# Patient Record
Sex: Female | Born: 1995 | Race: White | Hispanic: No | Marital: Married | Smoking: Never smoker
Health system: Southern US, Community
[De-identification: ages and names within clinical notes are randomized; demographics above are authoritative.]

## PROBLEM LIST (undated history)

## (undated) DIAGNOSIS — Z8041 Family history of malignant neoplasm of ovary: Secondary | ICD-10-CM

## (undated) DIAGNOSIS — Z789 Other specified health status: Secondary | ICD-10-CM

## (undated) DIAGNOSIS — Z8489 Family history of other specified conditions: Secondary | ICD-10-CM

## (undated) DIAGNOSIS — Z8481 Family history of carrier of genetic disease: Secondary | ICD-10-CM

## (undated) DIAGNOSIS — Z803 Family history of malignant neoplasm of breast: Secondary | ICD-10-CM

## (undated) DIAGNOSIS — O1490 Unspecified pre-eclampsia, unspecified trimester: Secondary | ICD-10-CM

## (undated) DIAGNOSIS — F419 Anxiety disorder, unspecified: Secondary | ICD-10-CM

## (undated) DIAGNOSIS — R7989 Other specified abnormal findings of blood chemistry: Secondary | ICD-10-CM

## (undated) DIAGNOSIS — O1410 Severe pre-eclampsia, unspecified trimester: Secondary | ICD-10-CM

## (undated) HISTORY — DX: Unspecified pre-eclampsia, unspecified trimester: O14.90

## (undated) HISTORY — DX: Family history of malignant neoplasm of breast: Z80.3

## (undated) HISTORY — PX: NO PAST SURGERIES: SHX2092

## (undated) HISTORY — DX: Family history of malignant neoplasm of ovary: Z80.41

## (undated) HISTORY — PX: WISDOM TOOTH EXTRACTION: SHX21

## (undated) HISTORY — DX: Other specified abnormal findings of blood chemistry: R79.89

## (undated) HISTORY — DX: Severe pre-eclampsia, unspecified trimester: O14.10

## (undated) HISTORY — DX: Family history of other specified conditions: Z84.89

## (undated) HISTORY — DX: Anxiety disorder, unspecified: F41.9

## (undated) HISTORY — DX: Family history of carrier of genetic disease: Z84.81

---

## 2014-01-28 ENCOUNTER — Emergency Department (HOSPITAL_COMMUNITY): Payer: No Typology Code available for payment source

## 2014-01-28 ENCOUNTER — Encounter (HOSPITAL_COMMUNITY): Payer: Self-pay | Admitting: Emergency Medicine

## 2014-01-28 ENCOUNTER — Emergency Department (HOSPITAL_COMMUNITY)
Admission: EM | Admit: 2014-01-28 | Discharge: 2014-01-28 | Disposition: A | Discharge status: Home / Self Care | Payer: No Typology Code available for payment source | Attending: Emergency Medicine | Admitting: Emergency Medicine

## 2014-01-28 DIAGNOSIS — R109 Unspecified abdominal pain: Secondary | ICD-10-CM | POA: Insufficient documentation

## 2014-01-28 DIAGNOSIS — Z3202 Encounter for pregnancy test, result negative: Secondary | ICD-10-CM | POA: Diagnosis not present

## 2014-01-28 DIAGNOSIS — N201 Calculus of ureter: Secondary | ICD-10-CM | POA: Diagnosis not present

## 2014-01-28 LAB — URINALYSIS, ROUTINE W REFLEX MICROSCOPIC
BILIRUBIN URINE: NEGATIVE
Glucose, UA: NEGATIVE mg/dL
Ketones, ur: NEGATIVE mg/dL
Leukocytes, UA: NEGATIVE
Nitrite: NEGATIVE
Protein, ur: NEGATIVE mg/dL
Specific Gravity, Urine: 1.016 (ref 1.005–1.030)
UROBILINOGEN UA: 0.2 mg/dL (ref 0.0–1.0)
pH: 5.5 (ref 5.0–8.0)

## 2014-01-28 LAB — POC URINE PREG, ED: Preg Test, Ur: NEGATIVE

## 2014-01-28 LAB — URINE MICROSCOPIC-ADD ON

## 2014-01-28 IMAGING — CT CT RENAL STONE PROTOCOL
1 series · 15 of 29 positions shown, 19 images · non-contrast
Comparison: None.

CLINICAL DATA: Left flank pain, hematuria.

EXAM:
CT RENAL STONE PROTOCOL
TECHNIQUE: Multidetector CT imaging of the abdomen and pelvis was performed
following the standard protocol without intravenous contrast

[Series 4: lung · axial · 0.62mm/px · z∈[-152,-27]mm · 15 of 29 slices shown, 19 images]
[im 3/29  soft-tissue]
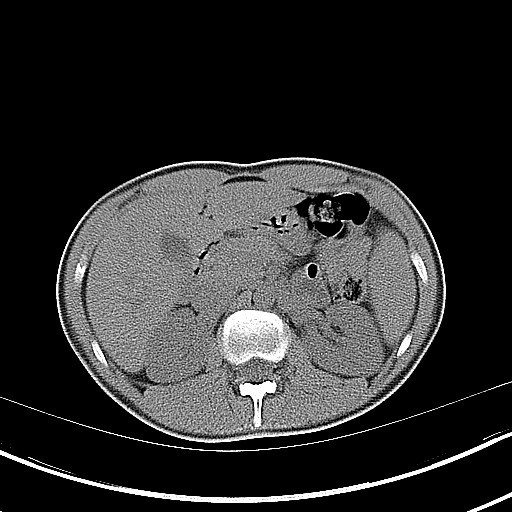
[im 3/29  bone]
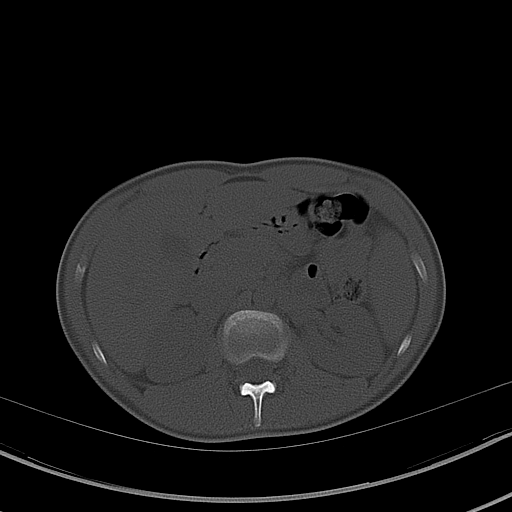
[im 5/29  soft-tissue]
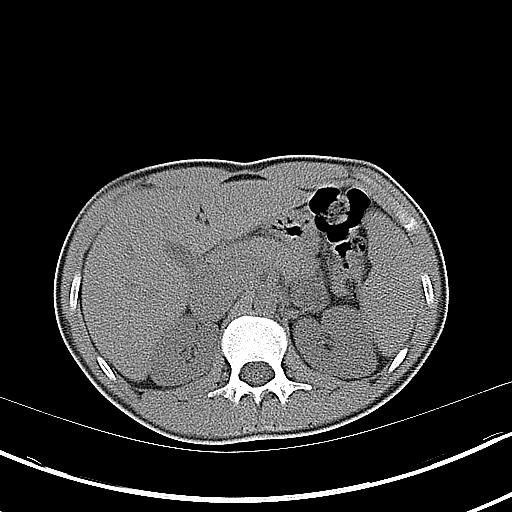
[im 7/29  soft-tissue]
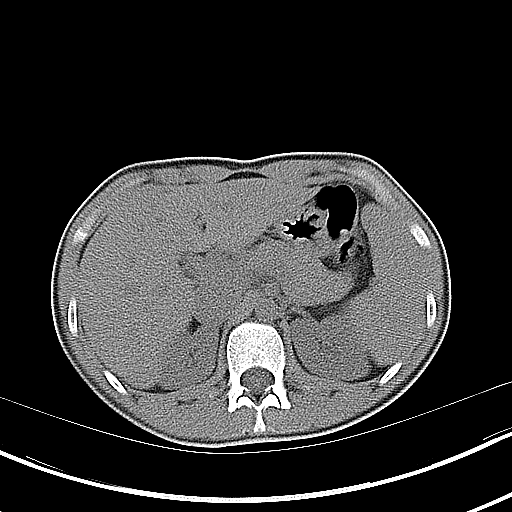
[im 9/29  soft-tissue]
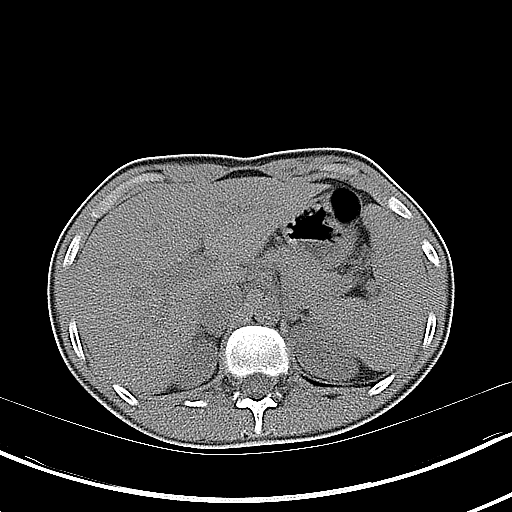
[im 11/29  soft-tissue]
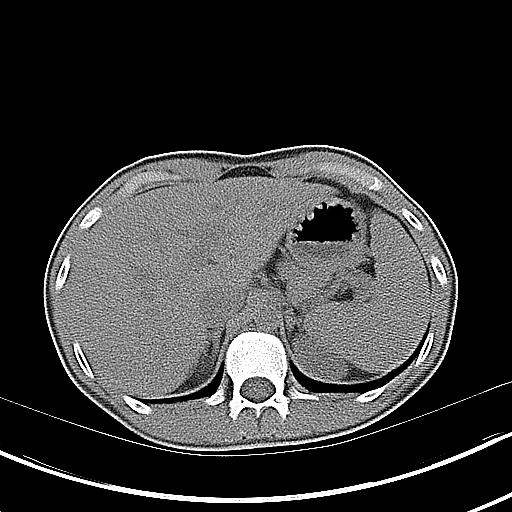
[im 13/29  soft-tissue]
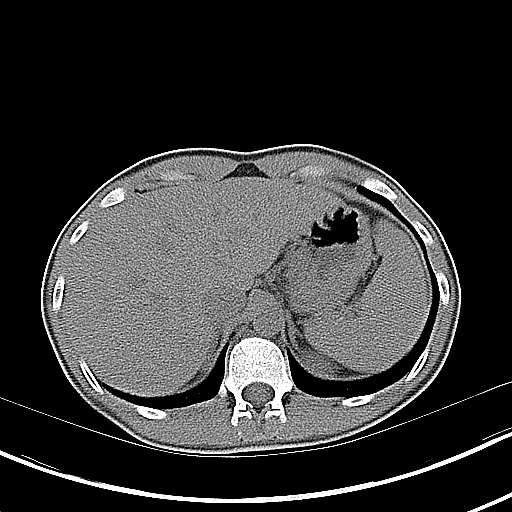
[im 15/29  soft-tissue]
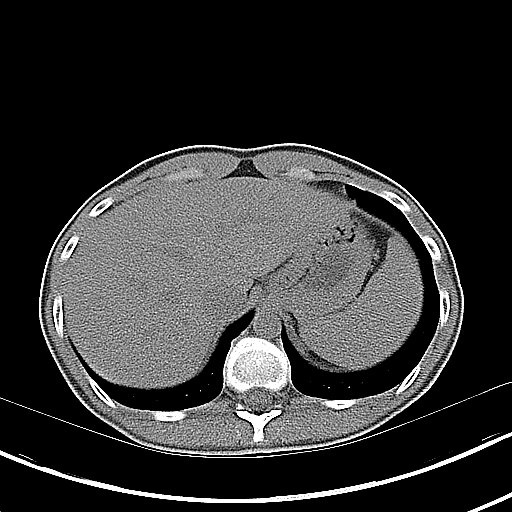
[im 17/29  soft-tissue]
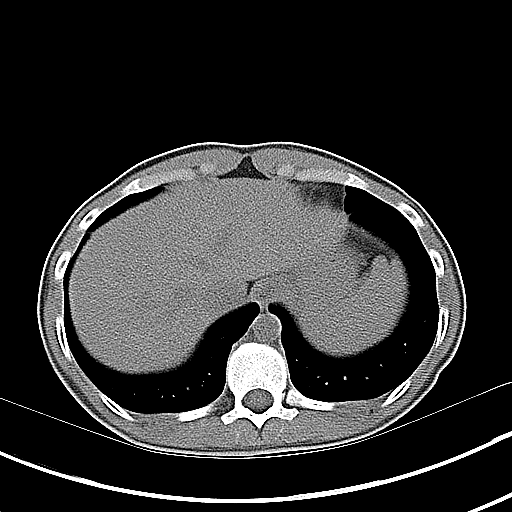
[im 19/29  soft-tissue]
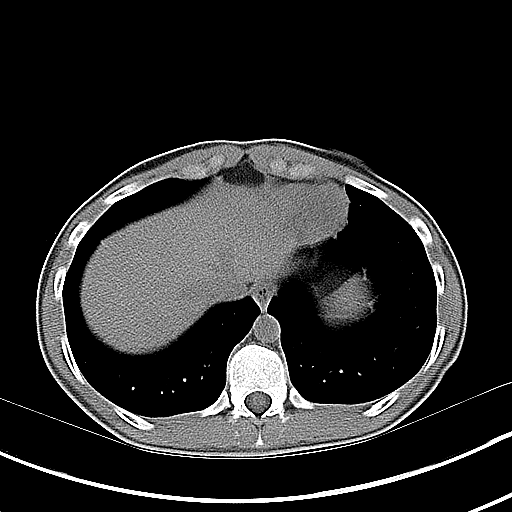
[im 19/29  bone]
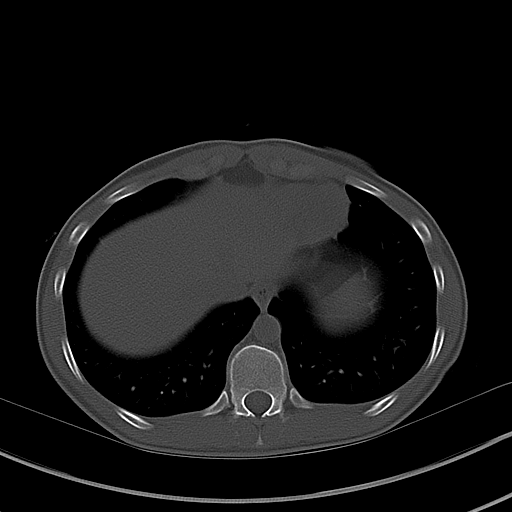
[im 21/29  soft-tissue]
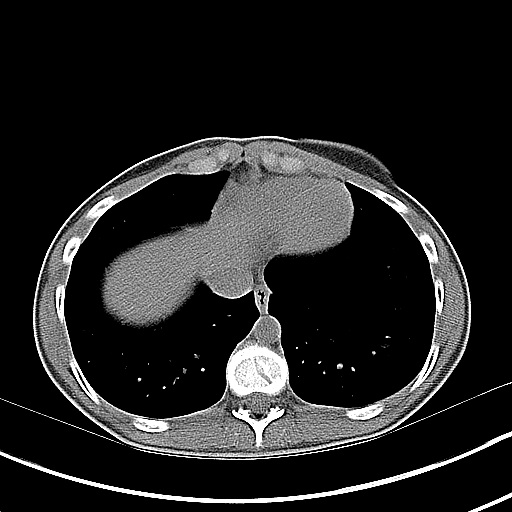
[im 23/29  soft-tissue]
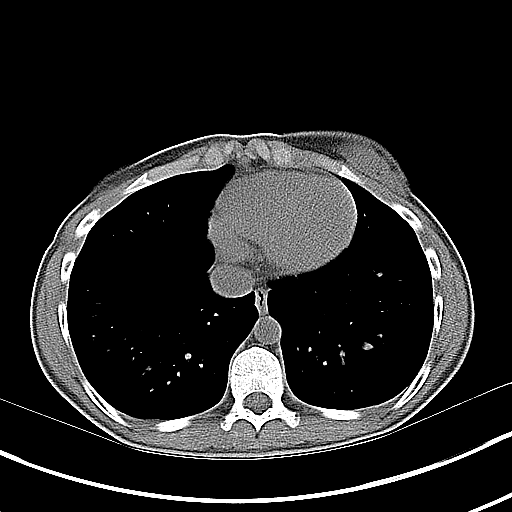
[im 25/29  soft-tissue]
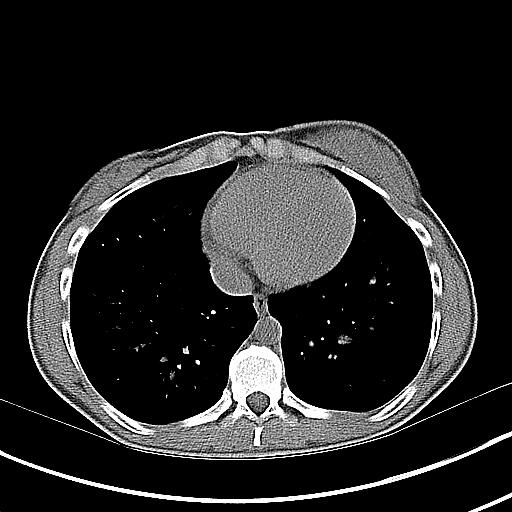
[im 25/29  lung]
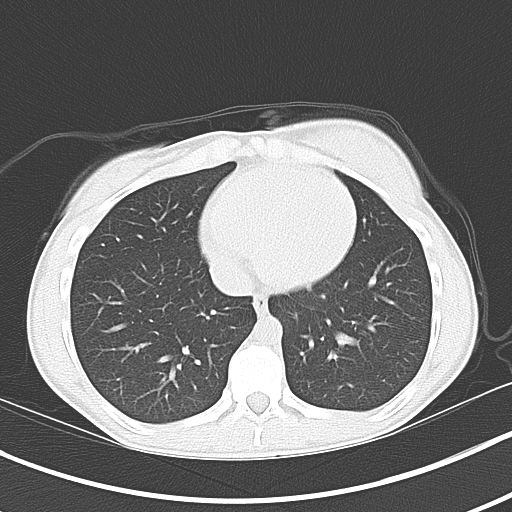
[im 26/29  lung]
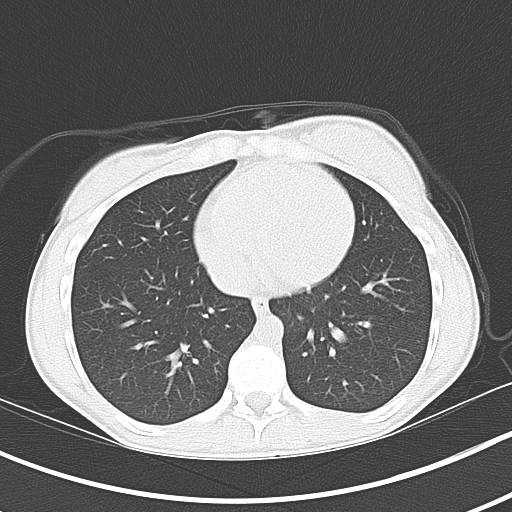
[im 27/29  soft-tissue]
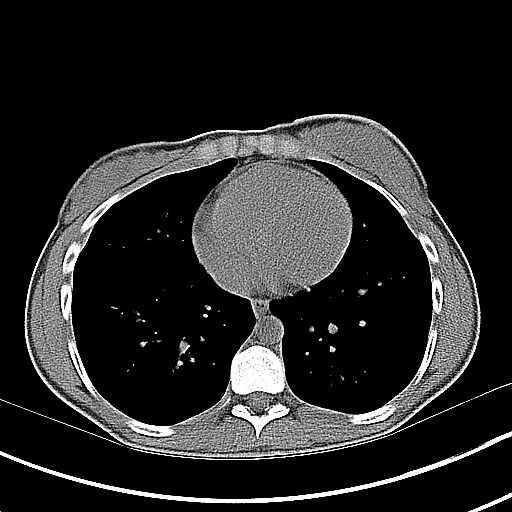
[im 27/29  lung]
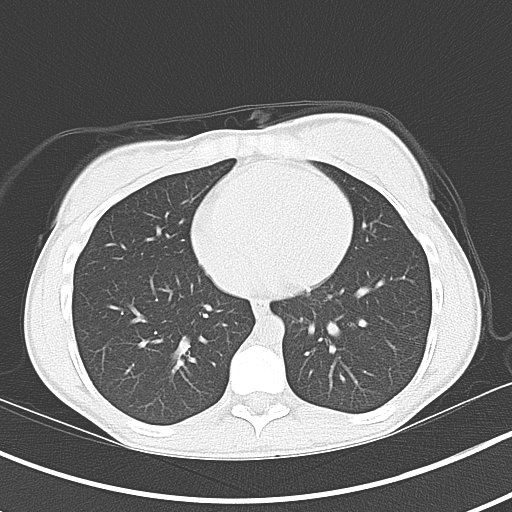
[im 28/29  lung]
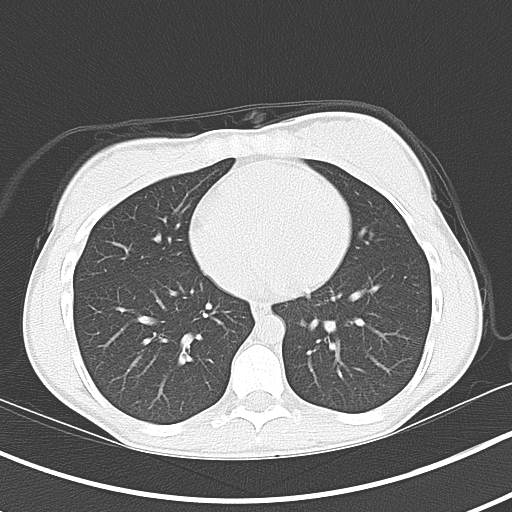

[15 of 29 positions shown; findings below may reference images not displayed]

FINDINGS: Bilateral pars defects are seen at L5. Visualized lung bases appear
normal.

No gallstones are noted. No focal abnormality is noted in the liver,
spleen or pancreas on these unenhanced images. Adrenal glands appear
normal. Bilateral nephrolithiasis is noted. No hydronephrosis or
renal obstruction is noted. 3 mm calculus is noted along the
posterior bladder wall left of midline; is uncertain if this is
within the bladder lumen or within the left ureterovesical junction.
Uterus appears normal. Appendix appears normal. There is no abnormal
fluid collection. There is no evidence of bowel obstruction. No
significant adenopathy is noted.
IMPRESSION: Bilateral nonobstructive nephrolithiasis is noted. No hydronephrosis
or renal obstruction is noted. 3 mm calculus is noted along the
posterior bladder wall just left of midline ; it is uncertain if
this is within the bladder lumen or potentially may be within the
left ureterovesical junction.

## 2014-01-28 MED ORDER — ONDANSETRON HCL 4 MG PO TABS: 4.0000 mg | ORAL_TABLET | Freq: Four times a day (QID) | ORAL | Status: DC | PRN

## 2014-01-28 MED ORDER — HYDROCODONE-ACETAMINOPHEN 5-325 MG PO TABS: 1.0000 | ORAL_TABLET | Freq: Four times a day (QID) | ORAL | Status: DC | PRN

## 2014-01-28 MED ORDER — TAMSULOSIN HCL 0.4 MG PO CAPS: 0.4000 mg | ORAL_CAPSULE | Freq: Once | ORAL | Status: DC

## 2014-01-28 NOTE — ED Notes (Signed)
Upon assessment pt reports frequency and dysuria starting yesterday. Pt reports left flank pain starting this am with 2 episodes of vomiting.

## 2014-01-28 NOTE — ED Notes (Signed)
Pt waiting urine results. Pt resting with family at bedside.

## 2014-01-28 NOTE — Discharge Instructions (Signed)
Kidney Stones °Kidney stones (urolithiasis) are solid masses that form inside your kidneys. The intense pain is caused by the stone moving through the kidney, ureter, bladder, and urethra (urinary tract). When the stone moves, the ureter starts to spasm around the stone. The stone is usually passed in your pee (urine).  °HOME CARE °· Drink enough fluids to keep your pee clear or pale yellow. This helps to get the stone out. °· Strain all pee through the provided strainer. Do not pee without peeing through the strainer, not even once. If you pee the stone out, catch it in the strainer. The stone may be as small as a grain of salt. Take this to your doctor. This will help your doctor figure out what you can do to try to prevent more kidney stones. °· Only take medicine as told by your doctor. °· Follow up with your doctor as told. °· Get follow-up X-rays as told by your doctor. °GET HELP IF: °You have pain that gets worse even if you have been taking pain medicine. °GET HELP RIGHT AWAY IF:  °· Your pain does not get better with medicine. °· You have a fever or shaking chills. °· Your pain increases and gets worse over 18 hours. °· You have new belly (abdominal) pain. °· You feel faint or pass out. °· You are unable to pee. °MAKE SURE YOU:  °· Understand these instructions. °· Will watch your condition. °· Will get help right away if you are not doing well or get worse. °Document Released: 11/12/2007 Document Revised: 01/26/2013 Document Reviewed: 10/27/2012 °ExitCare® Patient Information ©2015 [employer]. This information is not intended to replace advice given to you by your health care provider. Make sure you discuss any questions you have with your health care provider. ° °Dietary Guidelines to Help Prevent Kidney Stones °Your risk of kidney stones can be decreased by adjusting the foods you eat. The most important thing you can do is drink enough fluid. You should drink enough fluid to keep your urine clear or  pale yellow. The following guidelines provide specific information for the type of kidney stone you have had. °GUIDELINES ACCORDING TO TYPE OF KIDNEY STONE °Calcium Oxalate Kidney Stones °· Reduce the amount of salt you eat. Foods that have a lot of salt cause your body to release excess calcium into your urine. The excess calcium can combine with a substance called oxalate to form kidney stones. °· Reduce the amount of animal protein you eat if the amount you eat is excessive. Animal protein causes your body to release excess calcium into your urine. Ask your dietitian how much protein from animal sources you should be eating. °· Avoid foods that are high in oxalates. If you take vitamins, they should have less than 500 mg of vitamin C. Your body turns vitamin C into oxalates. You do not need to avoid fruits and vegetables high in vitamin C. °Calcium Phosphate Kidney Stones °· Reduce the amount of salt you eat to help prevent the release of excess calcium into your urine. °· Reduce the amount of animal protein you eat if the amount you eat is excessive. Animal protein causes your body to release excess calcium into your urine. Ask your dietitian how much protein from animal sources you should be eating. °· Get enough calcium from food or take a calcium supplement (ask your dietitian for recommendations). Food sources of calcium that do not increase your risk of kidney stones include: °¨ Broccoli. °¨ Dairy products,   such as cheese and yogurt. °¨ Pudding. °Uric Acid Kidney Stones °· Do not have more than 6 oz of animal protein per day. °FOOD SOURCES °Animal Protein Sources °· Meat (all types). °· Poultry. °· Eggs. °· Fish, seafood. °Foods High in Salt °· Salt seasonings. °· Soy sauce. °· Teriyaki sauce. °· Cured and processed meats. °· Salted crackers and snack foods. °· Fast food. °· Canned soups and most canned foods. °Foods High in Oxalates °· Grains: °¨ Amaranth. °¨ Barley. °¨ Grits. °¨ Wheat  germ. °¨ Bran. °¨ Buckwheat flour. °¨ All bran cereals. °¨ Pretzels. °¨ Whole wheat bread. °· Vegetables: °¨ Beans (wax). °¨ Beets and beet greens. °¨ Collard greens. °¨ Eggplant. °¨ Escarole. °¨ Leeks. °¨ Okra. °¨ Parsley. °¨ Rutabagas. °¨ Spinach. °¨ Swiss chard. °¨ Tomato paste. °¨ Fried potatoes. °¨ Sweet potatoes. °· Fruits: °¨ Red currants. °¨ Figs. °¨ Kiwi. °¨ Rhubarb. °· Meat and Other Protein Sources: °¨ Beans (dried). °¨ Soy burgers and other soybean products. °¨ Miso. °¨ Nuts (peanuts, almonds, pecans, cashews, hazelnuts). °¨ Nut butters. °¨ Sesame seeds and tahini (paste made of sesame seeds). °¨ Poppy seeds. °· Beverages: °¨ Chocolate drink mixes. °¨ Soy milk. °¨ Instant iced tea. °¨ Juices made from high-oxalate fruits or vegetables. °· Other: °¨ Carob. °¨ Chocolate. °¨ Fruitcake. °¨ Marmalades. °Document Released: 09/20/2010 Document Revised: 05/31/2013 Document Reviewed: 04/22/2013 °ExitCare® Patient Information ©2015 [employer]. This information is not intended to replace advice given to you by your health care provider. Make sure you discuss any questions you have with your health care provider. ° °

## 2014-01-28 NOTE — ED Notes (Signed)
Per EMS pt complaint of left flank pain, dysuria, and urinary frequency since 0500 this am.

## 2014-01-28 NOTE — ED Notes (Signed)
Pt transferred to CT.

## 2014-01-28 NOTE — ED Notes (Signed)
Bed: [id number] Expected date: 01/28/14 Expected time: 7:01 AM Means of arrival: Ambulance Comments: Flank pain

## 2014-01-28 NOTE — ED Provider Notes (Signed)
CSN: [id number]     Arrival date & time 01/28/14  0711 History   First MD Initiated Contact with Patient 01/28/14 [phone]     No chief complaint on file.    (Consider location/radiation/quality/duration/timing/severity/associated sxs/prior Treatment) Patient is a 18 y.o. female presenting with flank pain. The history is provided by the patient. No language interpreter was used.  Flank Pain The current episode started 3 to 5 hours ago. Episode frequency: intermittent. Progression since onset: waxing/waning. Pertinent negatives include no chest pain, no abdominal pain, no headaches and no shortness of breath. Associated symptoms comments: Nausea, vomiting x2, dysuria, frequency . Nothing aggravates the symptoms. Nothing relieves the symptoms. She has tried nothing for the symptoms. The treatment provided no relief.    History reviewed. No pertinent past medical history. History reviewed. No pertinent past surgical history. No family history on file. History  Substance Use Topics  . Smoking status: Never Smoker   . Smokeless tobacco: Not on file  . Alcohol Use: Yes     Comment: socially   OB History   Grav Para Term Preterm Abortions TAB SAB Ect Mult Living                 Review of Systems  Constitutional: Negative for fever, chills, diaphoresis, activity change, appetite change and fatigue.  HENT: Negative for congestion, facial swelling, rhinorrhea and sore throat.   Eyes: Negative for photophobia and discharge.  Respiratory: Negative for cough, chest tightness and shortness of breath.   Cardiovascular: Negative for chest pain, palpitations and leg swelling.  Gastrointestinal: Negative for nausea, vomiting, abdominal pain and diarrhea.  Endocrine: Negative for polydipsia and polyuria.  Genitourinary: Positive for dysuria, frequency and flank pain. Negative for difficulty urinating and pelvic pain.  Musculoskeletal: Negative for arthralgias, back pain, neck pain and neck stiffness.   Skin: Negative for color change and wound.  Allergic/Immunologic: Negative for immunocompromised state.  Neurological: Negative for facial asymmetry, weakness, numbness and headaches.  Hematological: Does not bruise/bleed easily.  Psychiatric/Behavioral: Negative for confusion and agitation.      Allergies  Review of patient's allergies indicates no known allergies.  Home Medications   Prior to Admission medications   Medication Sig Start Date End Date Taking? Authorizing Provider  HYDROcodone-acetaminophen (NORCO) 5-325 MG per tablet Take 1 tablet by mouth every 6 (six) hours as needed. 01/28/14   Toy Cookey, MD  ondansetron (ZOFRAN) 4 MG tablet Take 1 tablet (4 mg total) by mouth every 6 (six) hours as needed for nausea or vomiting. 01/28/14   Toy Cookey, MD  tamsulosin (FLOMAX) 0.4 MG CAPS capsule Take 1 capsule (0.4 mg total) by mouth once. 01/28/14   Toy Cookey, MD   BP 106/57  Pulse 72  Temp(Src) 98.1 F (36.7 C) (Oral)  Resp 16  SpO2 98%  LMP 12/21/2013 Physical Exam  Constitutional: She is oriented to person, place, and time. She appears well-developed and well-nourished. No distress.  HENT:  Head: Normocephalic and atraumatic.  Mouth/Throat: No oropharyngeal exudate.  Eyes: Pupils are equal, round, and reactive to light.  Neck: Normal range of motion. Neck supple.  Cardiovascular: Normal rate, regular rhythm and normal heart sounds.  Exam reveals no gallop and no friction rub.   No murmur heard. Pulmonary/Chest: Effort normal and breath sounds normal. No respiratory distress. She has no wheezes. She has no rales.  Abdominal: Soft. Bowel sounds are normal. She exhibits no distension and no mass. There is no tenderness. There is no rebound and no guarding.  Musculoskeletal: Normal range of motion. She exhibits no edema and no tenderness.  Neurological: She is alert and oriented to person, place, and time.  Skin: Skin is warm and dry.  Psychiatric: She has a  normal mood and affect.    ED Course  Procedures (including critical care time) Labs Review Labs Reviewed  URINALYSIS, ROUTINE W REFLEX MICROSCOPIC - Abnormal; Notable for the following:    Hgb urine dipstick LARGE (*)    All other components within normal limits  URINE MICROSCOPIC-ADD ON - Abnormal; Notable for the following:    Bacteria, UA FEW (*)    All other components within normal limits  URINE CULTURE  POC URINE PREG, ED    Imaging Review Ct Renal Stone Study  01/28/2014   CLINICAL DATA:  Left flank pain, hematuria.  EXAM: CT RENAL STONE PROTOCOL  TECHNIQUE: Multidetector CT imaging of the abdomen and pelvis was performed following the standard protocol without intravenous contrast  COMPARISON:  None.  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 Visualized lung bases appear normal.  No gallstones are noted. No focal abnormality is noted in the liver, spleen or pancreas on these unenhanced images. Adrenal glands appear normal. Bilateral nephrolithiasis is noted. No hydronephrosis or renal obstruction is noted. 3 mm calculus is noted along the posterior bladder wall left of midline; is uncertain if this is within the bladder lumen or within the left ureterovesical junction. Uterus appears normal. Appendix appears normal. There is no abnormal fluid collection. There is no evidence of bowel obstruction. No significant adenopathy is noted.  IMPRESSION: Bilateral nonobstructive nephrolithiasis is noted. No hydronephrosis or renal obstruction is noted. 3 mm calculus is noted along the posterior bladder wall just left of midline ; it is uncertain if this is within the bladder lumen or potentially may be within the left ureterovesical junction.   Electronically Signed   By: Roque LiasJames  Green M.D.   On: 01/28/2014 09:09     EKG Interpretation None      MDM   Final diagnoses:  Ureterolithiasis    Pt is a 18 y.o. female with Pmhx as above who presents with sharp, intermittent  L flank  pain, dysuria, and urinary frequency since 0500. She had mild frequency yesterday. She also reports subjective fever, chills, nausea, vomiting x2 this morning. No pain currently. No vaginal bleeding or d/c. No pain currently. On PE, VSS, pt in NAD. No CVA tenderness. Abdominal exam benign. POC preg negative. UA w/ large Hb, but not infected. CT stone study with 3mm stone either in the bladder lumen on UVJ. Will d/c home with instructions for supportive care with NO norco, zofran, flomax. Return precautions given for new or worsening symptoms including worsening or uncontrolled pain, fever, inability to tolerate liquids. She can f/u with uro in 1 week as needed if still having pain.          Toy CookeyMegan Samarth Ogle, MD 01/28/14 [phone]

## 2014-01-29 LAB — URINE CULTURE: Colony Count: 15000

## 2018-07-07 ENCOUNTER — Telehealth: Payer: Self-pay | Admitting: Licensed Clinical Social Worker

## 2018-07-07 ENCOUNTER — Encounter: Payer: Self-pay | Admitting: Licensed Clinical Social Worker

## 2018-07-07 NOTE — Telephone Encounter (Signed)
A genetic counseling appt has been scheduled for the pt to see Lacy Duverney on 2/10 at 1pm along w/her sister [facility]. Pt has agreed tot he appt date and time.

## 2018-07-15 ENCOUNTER — Other Ambulatory Visit: Payer: Self-pay | Admitting: Medical Oncology

## 2018-07-19 ENCOUNTER — Inpatient Hospital Stay: Payer: BLUE CROSS/BLUE SHIELD

## 2018-07-19 ENCOUNTER — Encounter: Payer: Self-pay | Admitting: Licensed Clinical Social Worker

## 2018-07-19 ENCOUNTER — Inpatient Hospital Stay: Payer: BLUE CROSS/BLUE SHIELD | Attending: Genetic Counselor | Admitting: Licensed Clinical Social Worker

## 2018-07-19 DIAGNOSIS — Z8041 Family history of malignant neoplasm of ovary: Secondary | ICD-10-CM | POA: Insufficient documentation

## 2018-07-19 DIAGNOSIS — Z8481 Family history of carrier of genetic disease: Secondary | ICD-10-CM | POA: Diagnosis not present

## 2018-07-19 DIAGNOSIS — Z803 Family history of malignant neoplasm of breast: Secondary | ICD-10-CM | POA: Insufficient documentation

## 2018-07-19 NOTE — Progress Notes (Signed)
REFERRING PROVIDER: Self-referred  PRIMARY PROVIDER:  System, Pcp Not In  PRIMARY REASON FOR VISIT:  1. Family history of BRCA1 gene positive   2. Family history of breast cancer   3. Family history of ovarian cancer      HISTORY OF PRESENT ILLNESS:   [name], a 23 y.o. female, was seen for a [place] cancer genetics consultation at the request due to her mother's recent genetic testing which revealed a BRCA1 pathogenic variant.  [name] presents to clinic today to discuss the possibility of a hereditary predisposition to cancer, genetic testing, and to further clarify her future cancer risks, as well as potential cancer risks for family members.    [name] is a 23 y.o. female with no personal history of cancer.    CANCER HISTORY:   No history exists.     HORMONAL RISK FACTORS:  Menarche was at age 80.  First live birth at age no children.  OCP use for approximately 0 years.  Ovaries intact: yes.  Hysterectomy: no.  Menopausal status: premenopausal.  HRT use: 0 years. Colonoscopy: no; not examined. Mammogram within the last year: no. Number of breast biopsies: 0.  Past Medical History:  Diagnosis Date  . Family history of BRCA1 gene positive   . Family history of breast cancer   . Family history of ovarian cancer     No past surgical history on file.  Social History   Socioeconomic History  . Marital status: Single    Spouse name: Not on file  . Number of children: Not on file  . Years of education: Not on file  . Highest education level: Not on file  Occupational History  . Not on file  Social Needs  . Financial resource strain: Not on file  . Food insecurity:    Worry: Not on file    Inability: Not on file  . Transportation needs:    Medical: Not on file    Non-medical: Not on file  Tobacco Use  . Smoking status: Never Smoker  Substance and Sexual Activity  . Alcohol use: Yes    Comment: socially  . Drug use: No  . Sexual activity:  Never  Lifestyle  . Physical activity:    Days per week: Not on file    Minutes per session: Not on file  . Stress: Not on file  Relationships  . Social connections:    Talks on phone: Not on file    Gets together: Not on file    Attends religious service: Not on file    Active member of club or organization: Not on file    Attends meetings of clubs or organizations: Not on file    Relationship status: Not on file  Other Topics Concern  . Not on file  Social History Narrative  . Not on file     FAMILY HISTORY:  We obtained a detailed, 4-generation family history.  Significant diagnoses are listed below: Family History  Problem Relation Age of Onset  . Breast cancer Mother 54       BRCA1 positive  . Ovarian cancer Other        MGGM's 2 sisters also had ovarian cancer  . Ovarian cancer Maternal Great-grandmother    [name] does not have children. She has 2 sisters, 2 brothers, and 1 maternal half-brother.   [name] mother was recently diagnosed with breast cancer at 62. She tested positive for a BRCA1 pathogenic variant. [name] showed  me a copy of this test report on her phone. [name] has one maternal aunt, one maternal uncle, no cancers. No cancers in her maternal cousins. Her maternal grandmother died at 40 of a heart attack. Her maternal grandfather is living and in his 51s. This grandfather's mother, the patient's great grandmother, had ovarian cancer. Her two sisters also had ovarian cancer.   [name] father is living at 70. [name] has a paternal aunt and a paternal uncle, both living with no cancer history, no cancers in paternal cousins. Her paternal grandparents are both living in their 66s.   [name] is aware of previous family history of genetic testing for hereditary cancer risks. Patient's maternal ancestors are of Caucasian descent, and paternal ancestors are of Caucasian descent. There is no reported Ashkenazi Jewish ancestry. There is no  known consanguinity.   GENETIC COUNSELING ASSESSMENT: Joan Sanchez is a 23 y.o. female with a family history of Hereditary Breast and Ovarian Cancer (BRCA1). We, therefore, discussed and recommended the following at today's visit.   DISCUSSION: We reviewed the BRCA1 gene specifically, noting the increased risk for breast, ovarian, pancreatic, and prostate cancers. We discussed management options should she test positive. We reviewed the characteristics, features and inheritance patterns of hereditary cancer syndromes. We also discussed genetic testing, including the appropriate family members to test, the process of testing, insurance coverage and turn-around-time for results. We discussed the implications of a negative, positive and/or variant of uncertain significant result. We recommended [name] pursue genetic testing for the known familial  BRCA1 pathogenic variant through [facility]. [facility] will provide this testing at no cost since she is within the 90-day window of her mother's testing.   We discussed that if she is found to have the familial BRCA1 pathogenic variant, there will be management recommendations such as increased cancer screenings and consideration of risk reducing surgeries.  A positive result could also have implications for the patient's family members.  A Negative result would mean we were unable to identify the known hereditary cause for cancer in her family but does not rule out the possibility of another hereditary cancer gene problem in her. There could be mutations that are undetectable by current technology, or in genes not yet tested or identified to increase cancer risk.    We discussed the potential to find a Variant of Uncertain Significance or VUS.  These are variants that have not yet been identified as pathogenic or benign, and it is unknown if this variant is associated with increased cancer risk or if this is a normal finding.  Most VUS's are reclassified to  benign or likely benign.   It should not be used to make medical management decisions. With time, we suspect the lab will determine the significance of any VUS's identified if any.   Based on [name] family history of a BRCA1 pathogenic variant, she meets NCC[facility] medical criteria for genetic testing. This testing will be provided at no cost through [facility].   We discussed that some people do not want to undergo genetic testing due to fear of genetic discrimination.  A federal law called the Genetic Information Non-Discrimination Act (GINA) of 2008 helps protect individuals against genetic discrimination based on their genetic test results.  It impacts both health insurance and employment.  For health insurance, it protects against increased premiums, being kicked off insurance or being forced to take a test in order to be insured.  For employment it protects against hiring, firing and  promoting decisions based on genetic test results.  Health status due to a cancer diagnosis is not protected under GINA.  This law does not protect life insurance, disability insurance, or other types of insurance  PLAN: After considering the risks, benefits, and limitations, [name]  provided informed consent to pursue genetic testing and the blood sample was sent to [facility] for analysis of the specific familial BRCA1 pathogenic variant. Results should be available within approximately 2-3 weeks' time, at which point they will be disclosed by telephone to [name], as will any additional recommendations warranted by these results. [name] will receive a summary of her genetic counseling visit and a copy of her results once available. This information will also be available in Epic.  Based on [name] family history, we recommended her other siblings and maternal relatives have genetic counseling and testing. [name] will let us know if we can be of any assistance in coordinating genetic  counseling and/or testing for these family members.  Lastly, we encouraged [name] to remain in contact with cancer genetics annually so that we can continuously update the family history and inform her of any changes in cancer genetics and testing that may be of benefit for this family.   Ms.  Joan Sanchez questions were answered to her satisfaction today. Our contact information was provided should additional questions or concerns arise. Thank you for the referral and allowing [place] to share in the care of your patient.   Faith Rogue, MS Genetic Counselor [email or web address] Phone: [phone]  The patient was seen for a total of 25 minutes in face-to-face genetic counseling.  The patient was accompanied today by her sister, Joan Sanchez.

## 2018-07-29 ENCOUNTER — Telehealth: Payer: Self-pay | Admitting: Licensed Clinical Social Worker

## 2018-07-29 NOTE — Telephone Encounter (Signed)
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 We discussed this result briefly; she would like to come in to talk in more detail. She is scheduled to see me 3/4 at 10 am.

## 2018-08-11 ENCOUNTER — Inpatient Hospital Stay: Payer: BLUE CROSS/BLUE SHIELD | Attending: Genetic Counselor | Admitting: Licensed Clinical Social Worker

## 2018-08-11 DIAGNOSIS — Z1379 Encounter for other screening for genetic and chromosomal anomalies: Secondary | ICD-10-CM

## 2018-08-11 NOTE — Progress Notes (Signed)
HPI:  [name] was previously seen in the [facility] clinic on 07/19/2018 due to a family history of breast and ovarian cancer, a known familial BRCA1 pathogenic variant and concerns regarding a hereditary predisposition to cancer. Please refer to our prior cancer genetics clinic note for more information regarding Joan Sanchez's medical, social and family histories, and our assessment and recommendations, at the time. [name] recent genetic test results were disclosed to her, as well as recommendations warranted by these results. These results and recommendations are discussed in more detail below.   FAMILY HISTORY:  We obtained a detailed, 4-generation family history.  Significant diagnoses are listed below: Family History  Problem Relation Age of Onset  . Breast cancer Mother 37       BRCA1 positive  . Ovarian cancer Other        MGGM's 2 sisters also had ovarian cancer  . Ovarian cancer Maternal Great-grandmother    [name] does not have children. She has 2 sisters, 2 brothers, and 1 maternal half-brother.   [name] mother was recently diagnosed with breast cancer at 40. She tested positive for a BRCA1 pathogenic variant. [name] showed me a copy of this test report on her phone. [name] has one maternal aunt, one maternal uncle, no cancers. No cancers in her maternal cousins. Her maternal grandmother died at 44 of a heart attack. Her maternal grandfather is living and in his 10s. This grandfather's mother, the patient's great grandmother, had ovarian cancer. Her two sisters also had ovarian cancer.   [name] father is living at 15. [name] has a paternal aunt and a paternal uncle, both living with no cancer history, no cancers in paternal cousins. Her paternal grandparents are both living in their 40s.  JoanParkeris awareof previous family history of genetic testing for hereditary cancer risks. Patient's maternal ancestors are of  [place], and paternal ancestors are of Caucasiandescent. There is noreported Ashkenazi Jewish ancestry. There is noknown consanguinity.  GENETIC TEST RESULTS:  At the time of Joan Sanchez's visit, we recommended she pursue genetic testing for the specific BRCA1 pathogenic variant that was identified in the family. 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 This VUS was also identified in her mother which is why it was tested.   At this time, it is unknown if this variant is associated with increased cancer risk or if this is a normal finding, but most variants such as this get reclassified to being inconsequential. It should not be used to make medical management decisions. With time, we suspect the lab will determine the significance of this variant, if any. If we do learn more about it, we will try to contact Joan Sanchez to discuss it further. However, it is important to stay in touch with [place] periodically and keep the address and phone number up to date.  The test report will be scanned into EPIC and will be located under the Molecular Pathology section of the Results Review tab. A portion of the result report is included below for reference.      DISCUSSION: BRCA1:  We discussed the cancers, inheritance, management asscociated with BRCA1, and the importance of telling family members about this result.   HBOC syndrome is characterized by an increased lifetime risk for generally adult-onset cancers  including breast, contralateral breast, female breast, ovarian, prostate and pancreatic.  Cancers associated with BRCA1: -Breast cancer, up to 87% risk -Ovarian cancer, up to 54% risk -Pancreatic cancer, 1-3% risk -Prostate cancer, elevated -Preliminary evidence for  association with melanoma  Inheritance Hereditary predisposition to cancer due to pathogenic variants in the BRCA1 gene has autosomal dominant inheritance. This means that an individual with a pathogenic variant has a 50% chance of passing the condition on to his/her offspring. Most cases are inherited from a parent, but some cases may occur spontaneously (i.e., an individual with a pathogenic variant has parents who do not have it). Identification of a pathogenic variant allows for the recognition of at-risk relatives who can pursue testing for the familial variant.  Management:   Breast cancer -clinical breast exams every 6-12 months beginning at 68 -age 79-29: annual breast MRI screening with contrast -age 41-75: annual mammogram with consideration of tomosynthesis and breast MRI screening with contrast -For women treated for breast cancer, screening of remaining breast tissue with annual mammography and breast MRI should continue (if they have not had bilateral mastectomy) -Consider option of risk-reducing mastectomy  Ovarian cancer -Recommend risk-reducing salpingo-oopherectomy (RRSO) typically between the ages of 73-40 and upon completion of childbearing -For patients who do not elect RRSO, transvaginal ultrasound combined with serum CA-125 for ovarian cancer screening has not been shown to be sufficiently sensitive or specific as to support a positive recommendation, but, although of uncertain benefit, may be considered at clinician's discretion starting at age 39-35 years  Pancreatic cancer -For individuals with a pathogenic/likely pathogenic variant in one of the pancreatic cancer susceptibility genes: -Consider pancreatic cancer screening beginning at age 25 (or 31 years younger than earliest exocrine pancreatic cancer diagnosis in the family, whichever is earlier), for individuals with exocrine pancreatic cancer in 1 or more first or second degree relatives from the same side of (or  presumed to be from same side of) family as the identified pathogenic/likely pathogenic variant -The panel does not currently recommend pancreatic cancer screening for carrier of mutations in genes other than STK11 and CDKN2A in the absence of a close family history of exocrine pancreatic cancer  Melanoma -There are no specific NCCN guidelines regarding melanoma screening for individuals with BRCA mutations.  -However, sun protection is recommended, and routine skin exams by a dermatologist can be considered.    These guidelines are based on current NCCN guidelines (NCCN v.1.2020).  These guidelines are subject to change and continually updated and should be directly referenced for future medical management.      Risk Reduction: There are several things that can be offered to individuals who are carriers for BRCA mutations that will reduce the risk for getting cancer.    The use of oral contraceptives can lower the risk for ovarian cancer, and, per case control studies, does not significantly increase the risk for breast cancer in BRCA patients.  Case control studies have shown that oral contraceptives can lower the risk for ovarian cancer in women with BRCA mutations. Additionally, a more recent meta-analysis, including one corhort (n=3,181) and one case control study (1,096 cases and 2,878 controls) also showed an inverse correlation between ovarian cancer and ever having used oral contraceptives (OR, 0.58; 95% CI = 0.46-0.73).  Studies on oral contraceptives and breast cancer have been conflicting, with some studies suggesting that there is not an increased risk for breast cancer in BRCA mutation carriers, while others suggest that there could be a risk.  That said,  two meta-analysis studies have shown that there is not an increased risk for breast cancer with oral contraceptive use in BRCA1 and BRCA2 carriers.    In individuals who have a prophylactic bilateral salpino-oophorectomy (BSO), the risk  for breast cancer is reduced by up to 50%.  It has been reported that short term hormone replacement therapy in women undergoing prophylactic BSO does not negate the reduction of breast cancer risk associated with surgery (1.2018 NCCN guidelines).  FAMILY MEMBERS: It is important that all of [name] relatives (both men and women) know of the presence of this gene mutation. Site-specific genetic testing can sort out who in the family is at risk and who is not.   [name] siblings have a 50% chance to have inherited this mutation. We recommend they have genetic testing for this same mutation, as identifying the presence of this mutation would allow them to also take advantage of risk-reducing measures.   PLAN:   1. These results will be made available to [name]. She currently does not have a regular doctor of any kind and plans to establish care with a gynecologist. She would like to be seen at our high risk breast clinic for further conversation regarding her management.    2. [name] plans to discuss these results with her family and will reach out to [place] if we can be of any assistance in coordinating genetic testing for any of her relatives.    SUPPORT AND RESOURCES: If [name] is interested in BRCA-specific information and support, there are two groups, Facing Our Risk (www.facingourrisk.com) and Bright Pink (www.brightpink.org) which some people have found useful. We gave her information from these two groups. They provide opportunities to speak with other individuals from high-risk families. To locate genetic counselors in other cities, visit the website of the [employer] (ArtistMovie.se) and Secretary/administrator for a Social worker by zip code.  We encouraged [name] to remain in contact with [place] on an annual basis so we can update her personal and family histories, and let her know of advances in cancer genetics that may benefit the family. Our contact number was  provided. [name] questions were answered to her satisfaction today, and she knows she is welcome to call anytime with additional questions.   Faith Rogue, MS Genetic Counselor [email or web address] Phone: [phone]  This patient was seen for a total of 20 minutes. She was accompanied by her sister, Ishmael Holter, her sister's girlfriend Merleen Nicely, and her partner Ovid Curd.

## 2018-08-18 ENCOUNTER — Telehealth: Payer: Self-pay | Admitting: Licensed Clinical Social Worker

## 2018-08-18 NOTE — Telephone Encounter (Signed)
Cld the pt to schedule an appt for the high risk breast clinic. Per Faith Rogue, pt is BRCA 1+. Lft the pt a vm to return my call.

## 2018-08-20 ENCOUNTER — Encounter: Payer: Self-pay | Admitting: Licensed Clinical Social Worker

## 2018-08-20 DIAGNOSIS — Z1509 Genetic susceptibility to other malignant neoplasm: Secondary | ICD-10-CM

## 2018-08-20 DIAGNOSIS — Z1501 Genetic susceptibility to malignant neoplasm of breast: Secondary | ICD-10-CM | POA: Insufficient documentation

## 2020-02-06 LAB — OB RESULTS CONSOLE HIV ANTIBODY (ROUTINE TESTING): HIV: NONREACTIVE

## 2020-02-06 LAB — OB RESULTS CONSOLE RUBELLA ANTIBODY, IGM: Rubella: IMMUNE

## 2020-02-06 LAB — OB RESULTS CONSOLE HEPATITIS B SURFACE ANTIGEN: Hepatitis B Surface Ag: NEGATIVE

## 2020-02-06 LAB — OB RESULTS CONSOLE RPR: RPR: NONREACTIVE

## 2020-06-09 NOTE — L&D Delivery Note (Signed)
Delivery Note At 7:52 PM a viable and healthy female was delivered via  (Presentation:  Left occiput, anterior).  APGAR: , ; weight pending .   Placenta status: Spontaneous, Intact.  Cord: 3 vessels  Anesthesia: Epidural Episiotomy: None Lacerations: 2nd degree;Perineal Suture Repair: 3.0 vicryl Est. Blood Loss (mL):  776 cc  The patient pushed for 79 minutes and delivered a vigorous female infant in the vertex LOA presentation.  The infant was passed to the waiting maternal abdomen and stimulated.  After a 1 minute delay in cord clamping the umbilical cord was clamped and cut.  The neonatology team attended the delivery due to the patient being on magnesium for 24 hours, low fetal baseline, and preterm status.  The infant was initially evaluated on the maternal chest and skin the skin was performed.  After several additional minutes the infant was passed to the waiting Isolette with the NICU team.  The infant was then returned to the maternal abdomen.  The placenta was spontaneously delivered with a three-vessel cord.  A second-degree perineal laceration was repaired with 3-0 Vicryl.  The uterus was firm after delivery.  There was a very small trickle of blood from the vagina after delivery.  In the face of a firm uterus TXA was administered prophylactically.  EBL was 776 cc.  Mom and baby are doing well after delivery    Mom to postpartum.  Baby to Couplet care / Skin to Skin.  Waynard Reeds 08/23/2020, 8:37 PM

## 2020-06-28 LAB — OB RESULTS CONSOLE RPR: RPR: NONREACTIVE

## 2020-08-22 ENCOUNTER — Inpatient Hospital Stay (HOSPITAL_COMMUNITY)
Admission: AD | Admit: 2020-08-22 | Discharge: 2020-08-28 | DRG: 806 | Disposition: A | Discharge status: Home / Self Care | Payer: BC Managed Care – PPO | Attending: Obstetrics and Gynecology | Admitting: Obstetrics and Gynecology

## 2020-08-22 ENCOUNTER — Other Ambulatory Visit: Payer: Self-pay

## 2020-08-22 ENCOUNTER — Encounter (HOSPITAL_COMMUNITY): Payer: Self-pay | Admitting: Obstetrics & Gynecology

## 2020-08-22 DIAGNOSIS — O9081 Anemia of the puerperium: Secondary | ICD-10-CM | POA: Diagnosis not present

## 2020-08-22 DIAGNOSIS — O1493 Unspecified pre-eclampsia, third trimester: Secondary | ICD-10-CM | POA: Diagnosis not present

## 2020-08-22 DIAGNOSIS — Z20822 Contact with and (suspected) exposure to covid-19: Secondary | ICD-10-CM | POA: Diagnosis present

## 2020-08-22 DIAGNOSIS — O26893 Other specified pregnancy related conditions, third trimester: Secondary | ICD-10-CM | POA: Diagnosis present

## 2020-08-22 DIAGNOSIS — O99824 Streptococcus B carrier state complicating childbirth: Secondary | ICD-10-CM | POA: Diagnosis present

## 2020-08-22 DIAGNOSIS — D62 Acute posthemorrhagic anemia: Secondary | ICD-10-CM | POA: Diagnosis not present

## 2020-08-22 DIAGNOSIS — O1414 Severe pre-eclampsia complicating childbirth: Principal | ICD-10-CM | POA: Diagnosis present

## 2020-08-22 DIAGNOSIS — Z3A36 36 weeks gestation of pregnancy: Secondary | ICD-10-CM

## 2020-08-22 DIAGNOSIS — Z6791 Unspecified blood type, Rh negative: Secondary | ICD-10-CM | POA: Diagnosis not present

## 2020-08-22 DIAGNOSIS — O1413 Severe pre-eclampsia, third trimester: Secondary | ICD-10-CM

## 2020-08-22 DIAGNOSIS — O1410 Severe pre-eclampsia, unspecified trimester: Secondary | ICD-10-CM | POA: Diagnosis present

## 2020-08-22 HISTORY — DX: Other specified health status: Z78.9

## 2020-08-22 LAB — CBC
HCT: 36 % (ref 36.0–46.0)
HCT: 36 % (ref 36.0–46.0)
HCT: 34.7 % — ABNORMAL LOW (ref 36.0–46.0)
Hemoglobin: 12.7 g/dL (ref 12.0–15.0)
Hemoglobin: 12.6 g/dL (ref 12.0–15.0)
Hemoglobin: 12.1 g/dL (ref 12.0–15.0)
MCH: 31.8 pg (ref 26.0–34.0)
MCH: 31.8 pg (ref 26.0–34.0)
MCH: 31.4 pg (ref 26.0–34.0)
MCHC: 35.3 g/dL (ref 30.0–36.0)
MCHC: 35 g/dL (ref 30.0–36.0)
MCHC: 34.9 g/dL (ref 30.0–36.0)
MCV: 90.2 fL (ref 80.0–100.0)
MCV: 90.9 fL (ref 80.0–100.0)
MCV: 90.1 fL (ref 80.0–100.0)
Platelets: 153 10*3/uL (ref 150–400)
Platelets: 135 10*3/uL — ABNORMAL LOW (ref 150–400)
Platelets: 129 10*3/uL — ABNORMAL LOW (ref 150–400)
RBC: 3.99 MIL/uL (ref 3.87–5.11)
RBC: 3.96 MIL/uL (ref 3.87–5.11)
RBC: 3.85 MIL/uL — ABNORMAL LOW (ref 3.87–5.11)
RDW: 12.8 % (ref 11.5–15.5)
RDW: 12.8 % (ref 11.5–15.5)
RDW: 12.9 % (ref 11.5–15.5)
WBC: 8.7 10*3/uL (ref 4.0–10.5)
WBC: 10.2 10*3/uL (ref 4.0–10.5)
WBC: 10.4 10*3/uL (ref 4.0–10.5)
nRBC: 0 % (ref 0.0–0.2)
nRBC: 0 % (ref 0.0–0.2)
nRBC: 0 % (ref 0.0–0.2)

## 2020-08-22 LAB — COMPREHENSIVE METABOLIC PANEL
ALT: 27 U/L (ref 0–44)
ALT: 27 U/L (ref 0–44)
ALT: 30 U/L (ref 0–44)
AST: 42 U/L — ABNORMAL HIGH (ref 15–41)
AST: 45 U/L — ABNORMAL HIGH (ref 15–41)
AST: 48 U/L — ABNORMAL HIGH (ref 15–41)
Albumin: 3.2 g/dL — ABNORMAL LOW (ref 3.5–5.0)
Albumin: 3.1 g/dL — ABNORMAL LOW (ref 3.5–5.0)
Albumin: 3 g/dL — ABNORMAL LOW (ref 3.5–5.0)
Alkaline Phosphatase: 157 U/L — ABNORMAL HIGH (ref 38–126)
Alkaline Phosphatase: 168 U/L — ABNORMAL HIGH (ref 38–126)
Alkaline Phosphatase: 154 U/L — ABNORMAL HIGH (ref 38–126)
Anion gap: 8 (ref 5–15)
Anion gap: 10 (ref 5–15)
Anion gap: 10 (ref 5–15)
BUN: 9 mg/dL (ref 6–20)
BUN: 8 mg/dL (ref 6–20)
BUN: 10 mg/dL (ref 6–20)
CO2: 20 mmol/L — ABNORMAL LOW (ref 22–32)
CO2: 18 mmol/L — ABNORMAL LOW (ref 22–32)
CO2: 17 mmol/L — ABNORMAL LOW (ref 22–32)
Calcium: 8.9 mg/dL (ref 8.9–10.3)
Calcium: 8.6 mg/dL — ABNORMAL LOW (ref 8.9–10.3)
Calcium: 8.4 mg/dL — ABNORMAL LOW (ref 8.9–10.3)
Chloride: 107 mmol/L (ref 98–111)
Chloride: 105 mmol/L (ref 98–111)
Chloride: 104 mmol/L (ref 98–111)
Creatinine, Ser: 1.23 mg/dL — ABNORMAL HIGH (ref 0.44–1.00)
Creatinine, Ser: 1.32 mg/dL — ABNORMAL HIGH (ref 0.44–1.00)
Creatinine, Ser: 1.25 mg/dL — ABNORMAL HIGH (ref 0.44–1.00)
GFR, Estimated: >60 mL/min (ref >60)
GFR, Estimated: 57 mL/min — ABNORMAL LOW (ref >60)
GFR, Estimated: >60 mL/min (ref >60)
Glucose, Bld: 75 mg/dL (ref 70–99)
Glucose, Bld: 145 mg/dL — ABNORMAL HIGH (ref 70–99)
Glucose, Bld: 105 mg/dL — ABNORMAL HIGH (ref 70–99)
Potassium: 4 mmol/L (ref 3.5–5.1)
Potassium: 4.2 mmol/L (ref 3.5–5.1)
Potassium: 4.5 mmol/L (ref 3.5–5.1)
Sodium: 135 mmol/L (ref 135–145)
Sodium: 133 mmol/L — ABNORMAL LOW (ref 135–145)
Sodium: 131 mmol/L — ABNORMAL LOW (ref 135–145)
Total Bilirubin: 1 mg/dL (ref 0.3–1.2)
Total Bilirubin: 0.7 mg/dL (ref 0.3–1.2)
Total Bilirubin: 0.9 mg/dL (ref 0.3–1.2)
Total Protein: 6.6 g/dL (ref 6.5–8.1)
Total Protein: 6.2 g/dL — ABNORMAL LOW (ref 6.5–8.1)
Total Protein: 6.4 g/dL — ABNORMAL LOW (ref 6.5–8.1)

## 2020-08-22 LAB — TYPE AND SCREEN
ABO/RH(D): O NEG
Antibody Screen: POSITIVE

## 2020-08-22 LAB — PROTEIN / CREATININE RATIO, URINE
Creatinine, Urine: 26.64 mg/dL
Protein Creatinine Ratio: 0.45 mg/mg{Cre} — ABNORMAL HIGH (ref 0.00–0.15)
Total Protein, Urine: 12 mg/dL

## 2020-08-22 LAB — GROUP B STREP BY PCR: Group B strep by PCR: POSITIVE — AB

## 2020-08-22 LAB — OB RESULTS CONSOLE GC/CHLAMYDIA
Chlamydia: NEGATIVE
Gonorrhea: NEGATIVE

## 2020-08-22 LAB — RESP PANEL BY RT-PCR (FLU A&B, COVID) ARPGX2
Influenza A by PCR: NEGATIVE
Influenza B by PCR: NEGATIVE
SARS Coronavirus 2 by RT PCR: NEGATIVE

## 2020-08-22 LAB — MAGNESIUM
Magnesium: 5.2 mg/dL — ABNORMAL HIGH (ref 1.7–2.4)
Magnesium: 5.6 mg/dL — ABNORMAL HIGH (ref 1.7–2.4)

## 2020-08-22 MED ORDER — ACETAMINOPHEN 325 MG PO TABS
650.0000 mg | ORAL_TABLET | ORAL | Status: DC | PRN
  Administered 2020-08-22 – 2020-08-23 (×2): 650 mg via ORAL
  Filled 2020-08-22 (×2): qty 2

## 2020-08-22 MED ORDER — ONDANSETRON HCL 4 MG/2ML IJ SOLN
4.0000 mg | Freq: Four times a day (QID) | INTRAMUSCULAR | Status: DC | PRN
  Administered 2020-08-23 (×2): 4 mg via INTRAVENOUS
  Filled 2020-08-22 (×2): qty 2

## 2020-08-22 MED ORDER — LIDOCAINE HCL (PF) 1 % IJ SOLN: 30.0000 mL | INTRAMUSCULAR | Status: DC | PRN

## 2020-08-22 MED ORDER — FENTANYL-BUPIVACAINE-NACL 0.5-0.125-0.9 MG/250ML-% EP SOLN
12.0000 mL/h | EPIDURAL | Status: DC | PRN
Start: 2020-08-22 — End: 2020-08-23
  Administered 2020-08-23 (×2): 12 mL/h via EPIDURAL
  Filled 2020-08-22 (×2): qty 250

## 2020-08-22 MED ORDER — SODIUM CHLORIDE 0.9 % IV SOLN
5.0000 10*6.[IU] | Freq: Once | INTRAVENOUS | Status: AC
  Administered 2020-08-22: 5 10*6.[IU] via INTRAVENOUS
  Filled 2020-08-22: qty 5

## 2020-08-22 MED ORDER — LACTATED RINGERS IV SOLN: INTRAVENOUS | Status: DC

## 2020-08-22 MED ORDER — MAGNESIUM SULFATE BOLUS VIA INFUSION
4.0000 g | Freq: Once | INTRAVENOUS | Status: AC
  Administered 2020-08-22: 4 g via INTRAVENOUS
  Filled 2020-08-22: qty 1000

## 2020-08-22 MED ORDER — HYDRALAZINE HCL 20 MG/ML IJ SOLN: 10.0000 mg | INTRAMUSCULAR | Status: DC | PRN

## 2020-08-22 MED ORDER — LABETALOL HCL 5 MG/ML IV SOLN
20.0000 mg | INTRAVENOUS | Status: DC | PRN
  Administered 2020-08-22 – 2020-08-26 (×2): 20 mg via INTRAVENOUS
  Filled 2020-08-22 (×3): qty 4

## 2020-08-22 MED ORDER — LACTATED RINGERS IV SOLN
500.0000 mL | Freq: Once | INTRAVENOUS | Status: AC
  Administered 2020-08-23: 500 mL via INTRAVENOUS

## 2020-08-22 MED ORDER — LABETALOL HCL 5 MG/ML IV SOLN
80.0000 mg | INTRAVENOUS | Status: DC | PRN
  Administered 2020-08-22: 80 mg via INTRAVENOUS
  Filled 2020-08-22: qty 16

## 2020-08-22 MED ORDER — PHENYLEPHRINE 40 MCG/ML (10ML) SYRINGE FOR IV PUSH (FOR BLOOD PRESSURE SUPPORT)
80.0000 ug | PREFILLED_SYRINGE | INTRAVENOUS | Status: DC | PRN
  Administered 2020-08-23 (×2): 80 ug via INTRAVENOUS
  Filled 2020-08-22: qty 10

## 2020-08-22 MED ORDER — PENICILLIN G POT IN DEXTROSE 60000 UNIT/ML IV SOLN
3.0000 10*6.[IU] | INTRAVENOUS | Status: DC
  Administered 2020-08-22 – 2020-08-23 (×6): 3 10*6.[IU] via INTRAVENOUS
  Filled 2020-08-22 (×7): qty 50

## 2020-08-22 MED ORDER — FENTANYL CITRATE (PF) 100 MCG/2ML IJ SOLN: 50.0000 ug | INTRAMUSCULAR | Status: DC | PRN

## 2020-08-22 MED ORDER — OXYCODONE-ACETAMINOPHEN 5-325 MG PO TABS: 2.0000 | ORAL_TABLET | ORAL | Status: DC | PRN

## 2020-08-22 MED ORDER — OXYTOCIN-SODIUM CHLORIDE 30-0.9 UT/500ML-% IV SOLN: 2.5000 [IU]/h | INTRAVENOUS | Status: DC

## 2020-08-22 MED ORDER — EPHEDRINE 5 MG/ML INJ: 10.0000 mg | INTRAVENOUS | Status: DC | PRN

## 2020-08-22 MED ORDER — LABETALOL HCL 5 MG/ML IV SOLN
40.0000 mg | INTRAVENOUS | Status: DC | PRN
  Administered 2020-08-22: 40 mg via INTRAVENOUS
  Filled 2020-08-22: qty 8

## 2020-08-22 MED ORDER — PHENYLEPHRINE 40 MCG/ML (10ML) SYRINGE FOR IV PUSH (FOR BLOOD PRESSURE SUPPORT): 80.0000 ug | PREFILLED_SYRINGE | INTRAVENOUS | Status: DC | PRN

## 2020-08-22 MED ORDER — MISOPROSTOL 25 MCG QUARTER TABLET
25.0000 ug | ORAL_TABLET | ORAL | Status: DC | PRN
  Administered 2020-08-22 (×2): 25 ug via VAGINAL
  Filled 2020-08-22 (×2): qty 1

## 2020-08-22 MED ORDER — BETAMETHASONE SOD PHOS & ACET 6 (3-3) MG/ML IJ SUSP
12.0000 mg | INTRAMUSCULAR | Status: AC
  Administered 2020-08-22 – 2020-08-23 (×2): 12 mg via INTRAMUSCULAR
  Filled 2020-08-22: qty 5

## 2020-08-22 MED ORDER — DIPHENHYDRAMINE HCL 50 MG/ML IJ SOLN
12.5000 mg | Freq: Once | INTRAMUSCULAR | Status: DC | PRN
  Filled 2020-08-22: qty 1

## 2020-08-22 MED ORDER — MAGNESIUM SULFATE 40 GM/1000ML IV SOLN
0.5000 g/h | INTRAVENOUS | Status: AC
  Administered 2020-08-22: 1 g/h via INTRAVENOUS
  Administered 2020-08-24: 0.5 g/h via INTRAVENOUS
  Filled 2020-08-22 (×2): qty 1000

## 2020-08-22 MED ORDER — DIPHENHYDRAMINE HCL 50 MG/ML IJ SOLN
12.5000 mg | INTRAMUSCULAR | Status: DC | PRN
  Administered 2020-08-23: 12.5 mg via INTRAVENOUS

## 2020-08-22 MED ORDER — OXYCODONE-ACETAMINOPHEN 5-325 MG PO TABS: 1.0000 | ORAL_TABLET | ORAL | Status: DC | PRN

## 2020-08-22 MED ORDER — OXYTOCIN-SODIUM CHLORIDE 30-0.9 UT/500ML-% IV SOLN
1.0000 m[IU]/min | INTRAVENOUS | Status: DC
  Administered 2020-08-22: 2 m[IU]/min via INTRAVENOUS
  Administered 2020-08-23: 6 m[IU]/min via INTRAVENOUS
  Filled 2020-08-22: qty 500

## 2020-08-22 MED ORDER — PROCHLORPERAZINE EDISYLATE 10 MG/2ML IJ SOLN
10.0000 mg | Freq: Once | INTRAMUSCULAR | Status: DC | PRN
  Filled 2020-08-22: qty 2

## 2020-08-22 MED ORDER — LABETALOL HCL 5 MG/ML IV SOLN
80.0000 mg | INTRAVENOUS | Status: DC | PRN
Start: 2020-08-22 — End: 2020-08-28

## 2020-08-22 MED ORDER — LACTATED RINGERS IV SOLN
500.0000 mL | INTRAVENOUS | Status: DC | PRN
Start: 2020-08-22 — End: 2020-08-23
  Administered 2020-08-23: 500 mL via INTRAVENOUS
  Administered 2020-08-23: 250 mL via INTRAVENOUS
  Administered 2020-08-23: 500 mL via INTRAVENOUS

## 2020-08-22 MED ORDER — LABETALOL HCL 5 MG/ML IV SOLN
40.0000 mg | INTRAVENOUS | Status: DC | PRN
Start: 2020-08-22 — End: 2020-08-28
  Filled 2020-08-22: qty 8

## 2020-08-22 MED ORDER — SOD CITRATE-CITRIC ACID 500-334 MG/5ML PO SOLN: 30.0000 mL | ORAL | Status: DC | PRN

## 2020-08-22 MED ORDER — OXYTOCIN BOLUS FROM INFUSION: 333.0000 mL | Freq: Once | INTRAVENOUS | Status: DC

## 2020-08-22 MED ORDER — TERBUTALINE SULFATE 1 MG/ML IJ SOLN: 0.2500 mg | Freq: Once | INTRAMUSCULAR | Status: DC | PRN

## 2020-08-22 MED ORDER — LABETALOL HCL 5 MG/ML IV SOLN
20.0000 mg | INTRAVENOUS | Status: DC | PRN
  Administered 2020-08-24: 20 mg via INTRAVENOUS

## 2020-08-22 NOTE — MAU Note (Signed)
Joan Sanchez is a 25 y.o. at [redacted]w[redacted]d here in MAU reporting: was sent over from the office for elevated blood pressure. No headache, states occasionally has floaters in vision but none today.   Onset of complaint: today  Pain score: 0/10  Vitals:   08/22/20 1018  BP: (!) 174/101  Pulse: 69  Resp: 16  Temp: 98.1 F (36.7 C)  SpO2: 99%     FHT: +FM  Lab orders placed from triage: UA

## 2020-08-22 NOTE — Plan of Care (Signed)
  Problem: Education: Goal: Knowledge of General Education information will improve Description: Including pain rating scale, medication(s)/side effects and non-pharmacologic comfort measures Outcome: Progressing   Problem: Health Behavior/Discharge Planning: Goal: Ability to manage health-related needs will improve Outcome: Progressing   Problem: Clinical Measurements: Goal: Ability to maintain clinical measurements within normal limits will improve Outcome: Progressing Goal: Will remain free from infection Outcome: Progressing Goal: Diagnostic test results will improve Outcome: Progressing Goal: Respiratory complications will improve Outcome: Progressing Goal: Cardiovascular complication will be avoided Outcome: Progressing   Problem: Activity: Goal: Risk for activity intolerance will decrease Outcome: Progressing   Problem: Nutrition: Goal: Adequate nutrition will be maintained Outcome: Progressing   Problem: Coping: Goal: Level of anxiety will decrease Outcome: Progressing   Problem: Elimination: Goal: Will not experience complications related to bowel motility Outcome: Progressing Goal: Will not experience complications related to urinary retention Outcome: Progressing   Problem: Pain Managment: Goal: General experience of comfort will improve Outcome: Progressing   Problem: Safety: Goal: Ability to remain free from injury will improve Outcome: Progressing   Problem: Skin Integrity: Goal: Risk for impaired skin integrity will decrease Outcome: Progressing   Problem: Education: Goal: Knowledge of Childbirth will improve Outcome: Progressing Goal: Ability to make informed decisions regarding treatment and plan of care will improve Outcome: Progressing Goal: Ability to state and carry out methods to decrease the pain will improve Outcome: Progressing Goal: Individualized Educational Video(s) Outcome: Progressing   Problem: Coping: Goal: Ability to  verbalize concerns and feelings about labor and delivery will improve Outcome: Progressing   Problem: Life Cycle: Goal: Ability to make normal progression through stages of labor will improve Outcome: Progressing Goal: Ability to effectively push during vaginal delivery will improve Outcome: Progressing   Problem: Role Relationship: Goal: Will demonstrate positive interactions with the child Outcome: Progressing   Problem: Safety: Goal: Risk of complications during labor and delivery will decrease Outcome: Progressing   Problem: Pain Management: Goal: Relief or control of pain from uterine contractions will improve Outcome: Progressing   Problem: Education: Goal: Knowledge of disease or condition will improve Outcome: Progressing Goal: Knowledge of the prescribed therapeutic regimen will improve Outcome: Progressing   Problem: Fluid Volume: Goal: Peripheral tissue perfusion will improve Outcome: Progressing   Problem: Clinical Measurements: Goal: Complications related to disease process, condition or treatment will be avoided or minimized Outcome: Progressing   

## 2020-08-22 NOTE — MAU Note (Signed)
Called Main Lab to check on status of Protein/Creatinine Ratio that was collected at 1030. Lab stated they would run the urine now.

## 2020-08-22 NOTE — MAU Provider Note (Signed)
History     CSN: [id number]  Arrival date and time: 08/22/20 1009   Event Date/Time   First Provider Initiated Contact with Patient 08/22/20 1104      Chief Complaint  Patient presents with   Hypertension   [name] is a 25 y.o. year old G82P0 female at 30w2dweeks gestation [place]. She reports having some occasional floaters in vision a few times, but none today. She denies VB or pain. She has had no problems in this pregnancy. [facility]   OB History    Gravida  1   Para      Term      Preterm      AB      Living        SAB      IAB      Ectopic      Multiple      Live Births              Past Medical History:  Diagnosis Date   Family history of BRCA1 gene positive    Family history of breast cancer    Family history of ovarian cancer     History reviewed. No pertinent surgical history.  Family History  Problem Relation Age of Onset   Breast cancer Mother 448      BRCA1 positive   Ovarian cancer Other        MGGM's 2 sisters also had ovarian cancer   Ovarian cancer Maternal Great-grandmother     Social History   Tobacco Use   Smoking status: Never Smoker   Smokeless tobacco: Never Used  Vaping Use   Vaping Use: Never used  Substance Use Topics   Alcohol use: Not Currently    Comment: socially   Drug use: No    Allergies: No Known Allergies  Medications Prior to Admission  Medication Sig Dispense Refill Last Dose   ferrous sulfate 324 MG TBEC Take 324 mg by mouth.   08/22/2020 at Unknown time   Prenatal Vit-Fe Fumarate-FA (PRENATAL MULTIVITAMIN) TABS tablet Take 1 tablet by mouth daily at 12 noon.   08/22/2020 at Unknown time   HYDROcodone-acetaminophen (NORCO) 5-325 MG per tablet Take 1 tablet by mouth every 6 (six) hours as needed. 15 tablet 0    ondansetron (ZOFRAN) 4 MG tablet Take 1  tablet (4 mg total) by mouth every 6 (six) hours as needed for nausea or vomiting. 15 tablet 0    tamsulosin (FLOMAX) 0.4 MG CAPS capsule Take 1 capsule (0.4 mg total) by mouth once. 14 capsule 0     Review of Systems  Constitutional: Negative.   HENT: Negative.   Eyes: Negative.   Respiratory: Negative.   Cardiovascular: Negative.   Gastrointestinal: Negative.   Endocrine: Negative.   Genitourinary: Negative.   Musculoskeletal: Negative.   Skin: Negative.   Allergic/Immunologic: Negative.   Neurological: Negative.   Hematological: Negative.   Psychiatric/Behavioral: Negative.    Physical Exam   Patient Vitals for the past 24 hrs:  BP Temp Temp src Pulse Resp SpO2 Height Weight  08/22/20 1301 (!) 152/114 -- -- 66 -- -- -- --  08/22/20 1231 (!) 137/100 -- -- (!) 59 -- -- -- --  08/22/20 1216 (!) 140/99 -- -- 61 -- 97 % -- --  08/22/20 1201 (!) 139/103 -- -- 60 -- -- -- --  08/22/20 1146 (!) 138/103 -- -- 67 -- -- -- --  08/22/20 1131 (!) 156/102 -- -- 60 -- -- -- --  08/22/20 1130 -- -- -- -- -- 97 % -- --  08/22/20 1125 -- -- -- -- -- 97 % -- --  08/22/20 1122 (!) 143/100 -- -- 70 -- -- -- --  08/22/20 1107 (!) 156/111 -- -- 68 -- 97 % -- --  08/22/20 1040 (!) 166/108 -- -- (!) 54 -- 99 % -- --  08/22/20 1018 (!) 174/101 98.1 F (36.7 C) Oral 69 16 99 % -- --  08/22/20 1015 -- -- -- -- -- -- 5' 1.5" (1.562 m) 60.7 kg     Physical Exam Vitals and nursing note reviewed.  Constitutional:      Appearance: Normal appearance. She is normal weight.  HENT:     Head: Normocephalic and atraumatic.  Cardiovascular:     Rate and Rhythm: Normal rate.     Pulses: Normal pulses.  Pulmonary:     Effort: Pulmonary effort is normal.  Abdominal:     Palpations: Abdomen is soft.  Genitourinary:    Comments: Not feeling UC's -- cervical exam deferred Musculoskeletal:        General: Normal range of motion.     Cervical back: Normal range of motion.  Skin:    General: Skin is  warm and dry.  Neurological:     Mental Status: She is alert and oriented to person, place, and time.  Psychiatric:        Mood and Affect: Mood normal.        Behavior: Behavior normal.        Thought Content: Thought content normal.        Judgment: Judgment normal.    REACTIVE NST - FHR: 125 bpm / moderate variability / accels present / decels absent / TOCO: regular every 1.5-6.5 mins  MAU Course  Procedures Patient informed that the ultrasound is considered a limited OB ultrasound and is not intended to be a complete ultrasound exam.  Patient also informed that the ultrasound is not being completed with the intent of assessing for fetal or placental anomalies or any pelvic abnormalities.  Explained that the purpose of todays ultrasound is to assess for presentation.  Baby was found to be in a cephalic presentation. Patient acknowledges the purpose of the exam and the limitations of the study.  MDM CCUA CBC CMP P/C Ratio Serial BP's  Start IV Labetalol Protocol   Results for orders placed or performed during the hospital encounter of 08/22/20 (from the past 24 hour(s))  Comprehensive metabolic panel     Status: Abnormal   Collection Time: 08/22/20 10:29 AM  Result Value Ref Range   Sodium 135 135 - 145 mmol/L   Potassium 4.0 3.5 - 5.1 mmol/L   Chloride 107 98 - 111 mmol/L   CO2 20 (L) 22 - 32 mmol/L   Glucose, Bld 75 70 - 99 mg/dL   BUN 9 6 - 20 mg/dL   Creatinine, Ser 1.23 (H) 0.44 - 1.00 mg/dL   Calcium 8.9 8.9 - 10.3 mg/dL   Total Protein 6.6 6.5 - 8.1 g/dL   Albumin 3.2 (L) 3.5 - 5.0 g/dL   AST 42 (H) 15 - 41 U/L   ALT 27 0 - 44 U/L   Alkaline Phosphatase 157 (H) 38 - 126 U/L   Total Bilirubin 1.0 0.3 - 1.2 mg/dL   GFR, Estimated >60 >60 mL/min  Anion gap 8 5 - 15  CBC     Status: None   Collection Time: 08/22/20 10:29 AM  Result Value Ref Range   WBC 8.7 4.0 - 10.5 K/uL   RBC 3.99 3.87 - 5.11 MIL/uL   Hemoglobin 12.7 12.0 - 15.0 g/dL   HCT 36.0 36.0 - 46.0  %   MCV 90.2 80.0 - 100.0 fL   MCH 31.8 26.0 - 34.0 pg   MCHC 35.3 30.0 - 36.0 g/dL   RDW 12.8 11.5 - 15.5 %   Platelets 153 150 - 400 K/uL   nRBC 0.0 0.0 - 0.2 %  Protein / creatinine ratio, urine     Status: Abnormal   Collection Time: 08/22/20 11:24 AM  Result Value Ref Range   Creatinine, Urine 26.64 mg/dL   Total Protein, Urine 12 mg/dL   Protein Creatinine Ratio 0.45 (H) 0.00 - 0.15 mg/mg[Cre]     Assessment and Plan  Pre-eclampsia in third trimester - Admit to L&D - Orders entered by Dr. Shellia Cleverly - Betamethasone 12 mg IM x 2 doses - Dr. Shellia Cleverly assumes care upon admission   Laury Deep, [place] 08/22/2020, 11:04 AM

## 2020-08-22 NOTE — Progress Notes (Addendum)
OBGYN Note Recent Labs    08/22/20 1029 08/22/20 1626 08/22/20 2016  WBC 8.7 10.2 10.4  HGB 12.7 12.6 12.1  HCT 36.0 36.0 34.7*  PLT 153 135* 129*  NA 135 133* 131*  K 4.0 4.2 4.5  CL 107 105 104  BUN 9 8 10   CREATININE 1.23* 1.32* 1.25*  AST 42* 45* 48*  ALT 27 27 30   BILITOT 1.0 0.7 0.9   Recent Labs    08/22/20 1029 08/22/20 1626 08/22/20 2016  CALCIUM 8.9 8.6* 8.4*  MG  --  5.2* 5.6*   Vitals:   08/22/20 1826 08/22/20 1925 08/22/20 1929 08/22/20 2025  BP: (!) 143/96  (!) 130/94 (!) 137/99  Pulse: 69  67 67  Resp: 17 18  16   Temp: 98.4 F (36.9 C)     TempSrc: Oral     SpO2:      Weight:      Height:       FHR 115, Cat 1 Toco q2-15mins  Joan Sanchez 08/24/20 is a 25 y.o. female G1P0 [redacted]w[redacted]d admitted for IOL due to severe preeclampsia 1. IOL: on admit SVE closed, received cytotec x2, plan for FB/Pit/AROM prn -GBS positive, on PCN 2. Severe preeclampsia: received IV labetalol protocol in MAU (20, 40, 80). Now mild range -Labs significant for proteinuria and Cr 1.23. Now Cr 1.25, AST 48 and plt 129. Continue to monitor -Reported mild HA, given tylenol. If worsens plan for compazine 10mg  IV and benadryl 12.5mg  IV once -Received mag 4g bolus, on mag 1g/h due to Cr, plan for labs q4h with magnesium level 3. Prematurity: received BMZ#1 3/16 1339, will not delay delivery given late preterm and severe preeclampsia  4. 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 Iron deficiency anemia: hgb 10.5 previously, now 12.7. On PO iron 6. Vaccines: s/p tdap in pregnancy. Declined flu, COVID vaccines  Joan Sanchez 08/22/2020, 9:36 PM

## 2020-08-22 NOTE — H&P (Signed)
Berklee Battey is a 25 y.o. female G70P0 2w2dpresenting from clinic for BP evaluation, found to meet criteria for severe preeclampsia by blood pressures and labs (UPC 0.45, Cr 1.23).  She reports no LOF, VB, Contractions. Normal FM. Denies HA/VC/RUQ pain/SOB.   Pregnancy c/b: 1. 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 Iron deficiency anemia: hgb 10.5 07/03/2020 3. Patient does have BRCA1 gene.  OB History    Gravida  1   Para      Term      Preterm      AB      Living        SAB      IAB      Ectopic      Multiple      Live Births             Past Medical History:  Diagnosis Date  . Family history of BRCA1 gene positive   . Family history of breast cancer   . Family history of ovarian cancer   . Medical history non-contributory    Past Surgical History:  Procedure Laterality Date  . NO PAST SURGERIES     Family History: family history includes Breast cancer (age of onset: 43 in her mother; Ovarian cancer in her maternal great-grandmother and another family member. Social History:  reports that she has never smoked. She has never used smokeless tobacco. She reports previous alcohol use. She reports that she does not use drugs.    Maternal Diabetes: No Genetic Screening: panorama low risk Maternal Ultrasounds/Referrals: Normal anatomy. Placenta anterior Fetal Ultrasounds or other Referrals:  None Maternal Substance Abuse:  No Significant Maternal Medications:  None Significant Maternal Lab Results:  None Other Comments:  None  Review of Systems Per HPI Exam Physical Exam  Dilation: Closed Effacement (%): Thick Station: -3 Exam by:: Mary JMartiniqueJohnson, RN Blood pressure (!) 131/104, pulse 69, temperature 97.8 F (36.6 C), temperature source Oral, resp. rate 18, height 5' 1.5" (1.562 m), weight 60.7 kg, SpO2 100 %. NAD, resting comfortably Gravid abdomen Fetal testing: FHR 110, Cat 1. Toco irregular Prenatal labs: ABO, Rh:  --/--/O NEG (03/16  1055) Antibody: POS (03/16 1055) Rubella: Immune (08/30 0000) RPR: Nonreactive (01/20 0000)  HBsAg: Negative (08/30 0000)  HIV: Non-reactive (08/30 0000)  GBS:     Chemistry Recent Labs  Lab 08/22/20 1029  NA 135  K 4.0  CL 107  CO2 20*  GLUCOSE 75  BUN 9  CREATININE 1.23*  CALCIUM 8.9  GFRNONAA >60  ANIONGAP 8    Recent Labs  Lab 08/22/20 1029  PROT 6.6  ALBUMIN 3.2*  AST 42*  ALT 27  ALKPHOS 157*  BILITOT 1.0   Hematology Recent Labs  Lab 08/22/20 1029  WBC 8.7  RBC 3.99  HGB 12.7  HCT 36.0  MCV 90.2  MCH 31.8  MCHC 35.3  RDW 12.8  PLT 153   Assessment/Plan: HKessler Kopinskiis a 25y.o. female G1P0 371w2ddmitted for IOL due to severe preeclampsia 1. IOL: on admit SVE closed, plan cytotec, FB/pit/AROM prn.  GBS pending, collected culture in clinic today, GBS PCR on admission. PCN ordered 2. Severe preeclampsia: received IV labetalol protocol in [facility]U (20, 40, 80).  -Labs significant for proteinuria and Cr 1.2.  -Received mag 4g bolus, on mag 1g/h due to Cr -Repeat labs in 6h 3. Rh neg: s/p rhogam in pregnancy 4. Iron deficiency anemia: hgb 10.5 previously, now 12.7. On PO iron 5. Vaccines:  s/p tdap in pregnancy. Declined flu, COVID vaccines  Lizbett Garciagarcia K Taam-Akelman 08/22/2020, 4:10 PM

## 2020-08-23 ENCOUNTER — Inpatient Hospital Stay (HOSPITAL_COMMUNITY): Payer: BC Managed Care – PPO | Admitting: Anesthesiology

## 2020-08-23 DIAGNOSIS — O1410 Severe pre-eclampsia, unspecified trimester: Secondary | ICD-10-CM | POA: Diagnosis present

## 2020-08-23 LAB — COMPREHENSIVE METABOLIC PANEL
ALT: 30 U/L (ref 0–44)
ALT: 28 U/L (ref 0–44)
ALT: 25 U/L (ref 0–44)
AST: 50 U/L — ABNORMAL HIGH (ref 15–41)
AST: 44 U/L — ABNORMAL HIGH (ref 15–41)
AST: 38 U/L (ref 15–41)
Albumin: 3.2 g/dL — ABNORMAL LOW (ref 3.5–5.0)
Albumin: 2.9 g/dL — ABNORMAL LOW (ref 3.5–5.0)
Albumin: 2.9 g/dL — ABNORMAL LOW (ref 3.5–5.0)
Alkaline Phosphatase: 156 U/L — ABNORMAL HIGH (ref 38–126)
Alkaline Phosphatase: 158 U/L — ABNORMAL HIGH (ref 38–126)
Alkaline Phosphatase: 150 U/L — ABNORMAL HIGH (ref 38–126)
Anion gap: 10 (ref 5–15)
Anion gap: 12 (ref 5–15)
Anion gap: 12 (ref 5–15)
BUN: 10 mg/dL (ref 6–20)
BUN: 11 mg/dL (ref 6–20)
BUN: 12 mg/dL (ref 6–20)
CO2: 18 mmol/L — ABNORMAL LOW (ref 22–32)
CO2: 18 mmol/L — ABNORMAL LOW (ref 22–32)
CO2: 18 mmol/L — ABNORMAL LOW (ref 22–32)
Calcium: 8.2 mg/dL — ABNORMAL LOW (ref 8.9–10.3)
Calcium: 7.9 mg/dL — ABNORMAL LOW (ref 8.9–10.3)
Calcium: 7.7 mg/dL — ABNORMAL LOW (ref 8.9–10.3)
Chloride: 103 mmol/L (ref 98–111)
Chloride: 101 mmol/L (ref 98–111)
Chloride: 100 mmol/L (ref 98–111)
Creatinine, Ser: 1.35 mg/dL — ABNORMAL HIGH (ref 0.44–1.00)
Creatinine, Ser: 1.37 mg/dL — ABNORMAL HIGH (ref 0.44–1.00)
Creatinine, Ser: 1.43 mg/dL — ABNORMAL HIGH (ref 0.44–1.00)
GFR, Estimated: 56 mL/min — ABNORMAL LOW (ref >60)
GFR, Estimated: 55 mL/min — ABNORMAL LOW (ref >60)
GFR, Estimated: 52 mL/min — ABNORMAL LOW (ref >60)
Glucose, Bld: 121 mg/dL — ABNORMAL HIGH (ref 70–99)
Glucose, Bld: 98 mg/dL (ref 70–99)
Glucose, Bld: 97 mg/dL (ref 70–99)
Potassium: 4.5 mmol/L (ref 3.5–5.1)
Potassium: 4.6 mmol/L (ref 3.5–5.1)
Potassium: 4.8 mmol/L (ref 3.5–5.1)
Sodium: 131 mmol/L — ABNORMAL LOW (ref 135–145)
Sodium: 131 mmol/L — ABNORMAL LOW (ref 135–145)
Sodium: 130 mmol/L — ABNORMAL LOW (ref 135–145)
Total Bilirubin: 0.9 mg/dL (ref 0.3–1.2)
Total Bilirubin: 1.1 mg/dL (ref 0.3–1.2)
Total Bilirubin: 1 mg/dL (ref 0.3–1.2)
Total Protein: 6.9 g/dL (ref 6.5–8.1)
Total Protein: 6.3 g/dL — ABNORMAL LOW (ref 6.5–8.1)
Total Protein: 5.9 g/dL — ABNORMAL LOW (ref 6.5–8.1)

## 2020-08-23 LAB — CBC
HCT: 28.8 % — ABNORMAL LOW (ref 36.0–46.0)
HCT: 33.8 % — ABNORMAL LOW (ref 36.0–46.0)
HCT: 34.4 % — ABNORMAL LOW (ref 36.0–46.0)
HCT: 35.5 % — ABNORMAL LOW (ref 36.0–46.0)
Hemoglobin: 11.5 g/dL — ABNORMAL LOW (ref 12.0–15.0)
Hemoglobin: 11.7 g/dL — ABNORMAL LOW (ref 12.0–15.0)
Hemoglobin: 12.7 g/dL (ref 12.0–15.0)
Hemoglobin: 9.9 g/dL — ABNORMAL LOW (ref 12.0–15.0)
MCH: 31.3 pg (ref 26.0–34.0)
MCH: 31.5 pg (ref 26.0–34.0)
MCH: 31.9 pg (ref 26.0–34.0)
MCH: 32.1 pg (ref 26.0–34.0)
MCHC: 34 g/dL (ref 30.0–36.0)
MCHC: 34 g/dL (ref 30.0–36.0)
MCHC: 34.4 g/dL (ref 30.0–36.0)
MCHC: 35.8 g/dL (ref 30.0–36.0)
MCV: 89.6 fL (ref 80.0–100.0)
MCV: 92 fL (ref 80.0–100.0)
MCV: 92.6 fL (ref 80.0–100.0)
MCV: 92.9 fL (ref 80.0–100.0)
Platelets: 143 10*3/uL — ABNORMAL LOW (ref 150–400)
Platelets: 149 10*3/uL — ABNORMAL LOW (ref 150–400)
Platelets: 153 10*3/uL (ref 150–400)
Platelets: 172 10*3/uL (ref 150–400)
RBC: 3.1 MIL/uL — ABNORMAL LOW (ref 3.87–5.11)
RBC: 3.65 MIL/uL — ABNORMAL LOW (ref 3.87–5.11)
RBC: 3.74 MIL/uL — ABNORMAL LOW (ref 3.87–5.11)
RBC: 3.96 MIL/uL (ref 3.87–5.11)
RDW: 13 % (ref 11.5–15.5)
RDW: 13 % (ref 11.5–15.5)
RDW: 13.1 % (ref 11.5–15.5)
RDW: 13.2 % (ref 11.5–15.5)
WBC: 10.9 10*3/uL — ABNORMAL HIGH (ref 4.0–10.5)
WBC: 11.3 10*3/uL — ABNORMAL HIGH (ref 4.0–10.5)
WBC: 14.3 10*3/uL — ABNORMAL HIGH (ref 4.0–10.5)
WBC: 25 10*3/uL — ABNORMAL HIGH (ref 4.0–10.5)
nRBC: 0 % (ref 0.0–0.2)
nRBC: 0 % (ref 0.0–0.2)
nRBC: 0 % (ref 0.0–0.2)
nRBC: 0 % (ref 0.0–0.2)

## 2020-08-23 LAB — MAGNESIUM
Magnesium: 6.5 mg/dL (ref 1.7–2.4)
Magnesium: 7.2 mg/dL (ref 1.7–2.4)
Magnesium: 8 mg/dL (ref 1.7–2.4)

## 2020-08-23 LAB — RPR: RPR Ser Ql: NONREACTIVE

## 2020-08-23 MED ORDER — OXYCODONE HCL 5 MG PO TABS: 5.0000 mg | ORAL_TABLET | ORAL | Status: DC | PRN

## 2020-08-23 MED ORDER — ACETAMINOPHEN 325 MG PO TABS
650.0000 mg | ORAL_TABLET | ORAL | Status: DC | PRN
  Administered 2020-08-24 – 2020-08-25 (×2): 650 mg via ORAL
  Filled 2020-08-23 (×2): qty 2

## 2020-08-23 MED ORDER — OXYCODONE HCL 5 MG PO TABS: 10.0000 mg | ORAL_TABLET | ORAL | Status: DC | PRN

## 2020-08-23 MED ORDER — BENZOCAINE-MENTHOL 20-0.5 % EX AERO
1.0000 "application " | INHALATION_SPRAY | CUTANEOUS | Status: DC | PRN
  Administered 2020-08-23: 1 via TOPICAL
  Filled 2020-08-23: qty 56

## 2020-08-23 MED ORDER — ONDANSETRON HCL 4 MG PO TABS: 4.0000 mg | ORAL_TABLET | ORAL | Status: DC | PRN

## 2020-08-23 MED ORDER — IBUPROFEN 600 MG PO TABS
600.0000 mg | ORAL_TABLET | Freq: Four times a day (QID) | ORAL | Status: DC
  Administered 2020-08-24 – 2020-08-28 (×17): 600 mg via ORAL
  Filled 2020-08-23 (×17): qty 1

## 2020-08-23 MED ORDER — TETANUS-DIPHTH-ACELL PERTUSSIS 5-2.5-18.5 LF-MCG/0.5 IM SUSY: 0.5000 mL | PREFILLED_SYRINGE | Freq: Once | INTRAMUSCULAR | Status: DC

## 2020-08-23 MED ORDER — SIMETHICONE 80 MG PO CHEW: 80.0000 mg | CHEWABLE_TABLET | ORAL | Status: DC | PRN

## 2020-08-23 MED ORDER — WITCH HAZEL-GLYCERIN EX PADS: 1.0000 "application " | MEDICATED_PAD | CUTANEOUS | Status: DC | PRN

## 2020-08-23 MED ORDER — DIPHENHYDRAMINE HCL 25 MG PO CAPS: 25.0000 mg | ORAL_CAPSULE | Freq: Four times a day (QID) | ORAL | Status: DC | PRN

## 2020-08-23 MED ORDER — ONDANSETRON HCL 4 MG/2ML IJ SOLN: 4.0000 mg | INTRAMUSCULAR | Status: DC | PRN

## 2020-08-23 MED ORDER — COCONUT OIL OIL
1.0000 "application " | TOPICAL_OIL | Status: DC | PRN
  Administered 2020-08-23: 1 via TOPICAL

## 2020-08-23 MED ORDER — TRANEXAMIC ACID-NACL 1000-0.7 MG/100ML-% IV SOLN: 1000.0000 mg | Freq: Once | INTRAVENOUS | Status: AC

## 2020-08-23 MED ORDER — TRANEXAMIC ACID-NACL 1000-0.7 MG/100ML-% IV SOLN
INTRAVENOUS | Status: AC
  Administered 2020-08-23: 1000 mg via INTRAVENOUS
  Filled 2020-08-23: qty 100

## 2020-08-23 MED ORDER — LIDOCAINE-EPINEPHRINE (PF) 2 %-1:200000 IJ SOLN
INTRAMUSCULAR | Status: DC | PRN
  Administered 2020-08-23: 3 mL via EPIDURAL

## 2020-08-23 MED ORDER — PRENATAL MULTIVITAMIN CH
1.0000 | ORAL_TABLET | Freq: Every day | ORAL | Status: DC
  Administered 2020-08-24 – 2020-08-27 (×4): 1 via ORAL
  Filled 2020-08-23 (×4): qty 1

## 2020-08-23 MED ORDER — DIBUCAINE (PERIANAL) 1 % EX OINT: 1.0000 "application " | TOPICAL_OINTMENT | CUTANEOUS | Status: DC | PRN

## 2020-08-23 MED ORDER — SENNOSIDES-DOCUSATE SODIUM 8.6-50 MG PO TABS
2.0000 | ORAL_TABLET | Freq: Every day | ORAL | Status: DC
  Administered 2020-08-24 – 2020-08-27 (×4): 2 via ORAL
  Filled 2020-08-23 (×5): qty 2

## 2020-08-23 MED ORDER — ZOLPIDEM TARTRATE 5 MG PO TABS: 5.0000 mg | ORAL_TABLET | Freq: Every evening | ORAL | Status: DC | PRN

## 2020-08-23 NOTE — Lactation Note (Signed)
This note was copied from a baby's chart. Lactation Consultation Note  Patient Name: Joan Sanchez [id number]'[id number] Date: 08/23/2020 Reason for consult: L&D Initial assessment;1st time breastfeeding;Late-preterm 34-36.6wks Age:26 hours, LPTI less than 6 lbs. LC entered L&D infant was being assess and was cuing to breastfeed.  Mom latched infant on her right breast using the football hold postion, infant latched swallows observed and infant breastfeed for 6 minutes actively before becoming tired. Mom was taught hand expression and infant was given 8 mls of colostrum by spoon. LC discussed LPTI feeding policy with parents and ask RN on OB Speciality to review again with parents, green sheet left in room ( 104). Parents understood feed infant by cues, 8 to 12+ times within 24 hours, STS, dim lights, do not make infant wait to breastfeed and limit total feedings to 30 minutes or less. Mom understands due to infant being LPTI and less than 6 lbs infant may need be supplement, mom wants to do milk sharing with her sister's EBM her sister is breastfeeding a 8 month old infant. Mom is also open to donor breast milk but prefers her sister's breast milk first. Mom will use the DEBP every 3 hours for 15 minutes and give infant back any EBM that she pumps first before offering donor breast milk or sister's milk. Mom knows to ask RN or LC for assistance with latching infant at the breast. LC discussed input and out put with parents.  Mom's plan:  1- BF infant according to cues, 8 to 12+ or more times following LPTI feeding policy. 2- Afterwards give infant donor breast milk after each feeding and tomorrow mom will start giving infant her sister's EBM ( milk sharing). 3- Mom will pump every 3 hours for 15 minutes on initial setting. Maternal Data Has patient been taught Hand Expression?: Yes Does the patient have breastfeeding experience prior to this delivery?: No  Feeding Mother's Current Feeding Choice:  Breast Milk (Mom plans to breastfeed and will do milk sharing with her sister who has 26 month old infant.)  LATCH Score Latch: Grasps breast easily, tongue down, lips flanged, rhythmical sucking.  Audible Swallowing: Spontaneous and intermittent  Type of Nipple: Everted at rest and after stimulation  Comfort (Breast/Nipple): Soft / non-tender  Hold (Positioning): Assistance needed to correctly position infant at breast and maintain latch.  LATCH Score: 9   Lactation Tools Discussed/Used Tools: Pump Breast pump type: Double-Electric Breast Pump Pump Education: Setup, frequency, and cleaning;Milk Storage Reason for Pumping: Mom will use DEBP to help establish her milk supply and due to infant being LPTI Pumping frequency: Mom will pump every 3 hours for 15 minutes on inital setting  Interventions Interventions: Breast feeding basics reviewed;Assisted with latch;Skin to skin;Hand express;Breast compression;Adjust position;Support pillows;Position options;Expressed milk;DEBP;Education  Discharge Pump: DEBP  Consult Status Consult Status: Follow-up Date: 08/24/20 Follow-up type: In-patient    Danelle Earthly 08/23/2020, 10:13 PM

## 2020-08-23 NOTE — Progress Notes (Signed)
Patient ID: Joan Sanchez, female   DOB: March 20, 1996, 25 y.o.   MRN: [id number]  S. Pt comfortable AFVSS FHT 110 + small accels, decreased variability. + accels with scalp stim cvx 8-9/90/+1 toco Q 2-6  Cr 1.4, Mag 8  Decrease mag to 0.5 g/hr

## 2020-08-23 NOTE — Progress Notes (Signed)
Date and time results received: 08/23/20 0814   Test: Magnesium Critical Value: 7.2  Name of Provider Notified: Dr Tenny Craw  Orders Received? Or Actions Taken?: Actions Taken: none, range therapeutic

## 2020-08-23 NOTE — Progress Notes (Signed)
OBGYN Note S: reports mild HA, declines tylenol,  Vitals:   08/23/20 0705 08/23/20 0710 08/23/20 0715 08/23/20 0731  BP:    123/78  Pulse:    82  Resp:      Temp:    97.9 F (36.6 C)  TempSrc:    Oral  SpO2: 100% 100% 99%   Weight:      Height:        FHR 115, was having late decels, now cat 1 Toco q55mins  SVE 3/70/-1  Joan Sanchez is a 25 y.o. female G1P0 [redacted]w[redacted]d admitted for IOL due to severe preeclampsia 1. IOL: on admit SVE closed, received cytotec x2, now on pitocin. AROM prn -GBS positive, on PCN 2. Severe preeclampsia: received IV labetalol protocol in [facility] (20, 40, 80). Now mild range -Labs significant for proteinuria and Cr 1.23. Now Cr 1.25, AST 48 and plt 129. Continue to monitor, AM labs pending -Reported mild HA, given tylenol. Reports very mild HA this AM, denies any other symptoms -Received mag 4g bolus, on mag 1g/h due to Cr, plan for labs q4h with magnesium level 3. Prematurity: received BMZ#1 3/16 1339, will not delay delivery given late preterm and severe preeclampsia  4. 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 Iron deficiency anemia: hgb 10.5 previously, now 12.7. On PO iron 6. Vaccines: s/p tdap in pregnancy. Declined flu, COVID vaccines  Joan Sanchez 08/22/2020, 7:38 AM

## 2020-08-23 NOTE — Plan of Care (Incomplete)
  Problem: Education: Goal: Knowledge of General Education information will improve Description: Including pain rating scale, medication(s)/side effects and non-pharmacologic comfort measures Outcome: Completed/Met   Problem: Health Behavior/Discharge Planning: Goal: Ability to manage health-related needs will improve Outcome: Completed/Met   Problem: Clinical Measurements: Goal: Ability to maintain clinical measurements within normal limits will improve Outcome: Completed/Met Goal: Will remain free from infection Outcome: Completed/Met Goal: Diagnostic test results will improve Outcome: Completed/Met Goal: Respiratory complications will improve Outcome: Completed/Met Goal: Cardiovascular complication will be avoided Outcome: Completed/Met   Problem: Activity: Goal: Risk for activity intolerance will decrease Outcome: Completed/Met   Problem: Nutrition: Goal: Adequate nutrition will be maintained Outcome: Completed/Met   Problem: Coping: Goal: Level of anxiety will decrease Outcome: Completed/Met   Problem: Elimination: Goal: Will not experience complications related to bowel motility Outcome: Completed/Met Goal: Will not experience complications related to urinary retention Outcome: Completed/Met   Problem: Pain Managment: Goal: General experience of comfort will improve Outcome: Completed/Met   Problem: Safety: Goal: Ability to remain free from injury will improve Outcome: Completed/Met   Problem: Skin Integrity: Goal: Risk for impaired skin integrity will decrease Outcome: Completed/Met   Problem: Education: Goal: Knowledge of Childbirth will improve Outcome: Completed/Met Goal: Ability to make informed decisions regarding treatment and plan of care will improve Outcome: Completed/Met Goal: Ability to state and carry out methods to decrease the pain will improve Outcome: Completed/Met Goal: Individualized Educational Video(s) Outcome: Completed/Met    Problem: Coping: Goal: Ability to verbalize concerns and feelings about labor and delivery will improve Outcome: Completed/Met   Problem: Life Cycle: Goal: Ability to make normal progression through stages of labor will improve Outcome: Completed/Met Goal: Ability to effectively push during vaginal delivery will improve Outcome: Completed/Met   Problem: Role Relationship: Goal: Will demonstrate positive interactions with the child Outcome: Completed/Met   Problem: Safety: Goal: Risk of complications during labor and delivery will decrease Outcome: Completed/Met   Problem: Pain Management: Goal: Relief or control of pain from uterine contractions will improve Outcome: Completed/Met   Problem: Education: Goal: Knowledge of disease or condition will improve Outcome: Completed/Met Goal: Knowledge of the prescribed therapeutic regimen will improve Outcome: Completed/Met   Problem: Fluid Volume: Goal: Peripheral tissue perfusion will improve Outcome: Completed/Met   Problem: Clinical Measurements: Goal: Complications related to disease process, condition or treatment will be avoided or minimized Outcome: Completed/Met   

## 2020-08-23 NOTE — Anesthesia Preprocedure Evaluation (Signed)
Anesthesia Evaluation  Patient identified by MRN, date of birth, ID band Patient awake    Reviewed: Allergy & Precautions, NPO status , Patient's Chart, lab work & pertinent test results  Airway Mallampati: I  TM Distance: >3 FB Neck ROM: Full    Dental no notable dental hx. (+) Teeth Intact, Dental Advisory Given   Pulmonary neg pulmonary ROS,    Pulmonary exam normal breath sounds clear to auscultation       Cardiovascular hypertension (severe preE on mag), Normal cardiovascular exam Rhythm:Regular Rate:Normal     Neuro/Psych negative neurological ROS  negative psych ROS   GI/Hepatic negative GI ROS, Neg liver ROS,   Endo/Other  negative endocrine ROS  Renal/GU negative Renal ROS  negative genitourinary   Musculoskeletal negative musculoskeletal ROS (+)   Abdominal   Peds  Hematology negative hematology ROS (+)   Anesthesia Other Findings IOL for severe preE on mag now c/f HELLP  Reproductive/Obstetrics (+) Pregnancy                             Anesthesia Physical Anesthesia Plan  ASA: III  Anesthesia Plan: Epidural   Post-op Pain Management:    Induction:   PONV Risk Score and Plan: Treatment may vary due to age or medical condition  Airway Management Planned: Natural Airway  Additional Equipment:   Intra-op Plan:   Post-operative Plan:   Informed Consent: I have reviewed the patients History and Physical, chart, labs and discussed the procedure including the risks, benefits and alternatives for the proposed anesthesia with the patient or authorized representative who has indicated his/her understanding and acceptance.       Plan Discussed with: Anesthesiologist  Anesthesia Plan Comments: (Patient identified. Risks, benefits, options discussed with patient including but not limited to bleeding, infection, nerve damage, paralysis, failed block, incomplete pain control,  headache, blood pressure changes, nausea, vomiting, reactions to medication, itching, and post partum back pain. Confirmed with bedside nurse the patient's most recent platelet count. Confirmed with the patient that they are not taking any anticoagulation, have any bleeding history or any family history of bleeding disorders. Patient expressed understanding and wishes to proceed. All questions were answered. )        Anesthesia Quick Evaluation

## 2020-08-23 NOTE — Progress Notes (Signed)
Date and time results received: 08/23/20 at 1435  Test: Magnesium Critical Value: 8.0  Name of Provider Notified: Dr. Tenny Craw  Orders Received? Or Actions Taken?: Orders Received - See Orders for details  Reduce magnesium to 0.5 g/hr

## 2020-08-23 NOTE — Anesthesia Procedure Notes (Signed)
Epidural Patient location during procedure: OB Start time: 08/23/2020 3:05 AM End time: 08/23/2020 3:15 AM  Staffing Anesthesiologist: Elmer Picker, MD Performed: anesthesiologist   Preanesthetic Checklist Completed: patient identified, IV checked, risks and benefits discussed, monitors and equipment checked, pre-op evaluation and timeout performed  Epidural Patient position: sitting Prep: DuraPrep and site prepped and draped Patient monitoring: continuous pulse ox, blood pressure, heart rate and cardiac monitor Approach: midline Location: L3-L4 Injection technique: LOR air  Needle:  Needle type: Tuohy  Needle gauge: 17 G Needle length: 9 cm Needle insertion depth: 4.5 cm Catheter type: closed end flexible Catheter size: 19 Gauge Catheter at skin depth: 10 cm Test dose: negative  Assessment Sensory level: T8 Events: blood not aspirated, injection not painful, no injection resistance, no paresthesia and negative IV test  Additional Notes Patient identified. Risks/Benefits/Options discussed with patient including but not limited to bleeding, infection, nerve damage, paralysis, failed block, incomplete pain control, headache, blood pressure changes, nausea, vomiting, reactions to medication both or allergic, itching and postpartum back pain. Confirmed with bedside nurse the patient's most recent platelet count. Confirmed with patient that they are not currently taking any anticoagulation, have any bleeding history or any family history of bleeding disorders. Patient expressed understanding and wished to proceed. All questions were answered. Sterile technique was used throughout the entire procedure. Please see nursing notes for vital signs. Test dose was given through epidural catheter and negative prior to continuing to dose epidural or start infusion. Warning signs of high block given to the patient including shortness of breath, tingling/numbness in hands, complete motor block,  or any concerning symptoms with instructions to call for help. Patient was given instructions on fall risk and not to get out of bed. All questions and concerns addressed with instructions to call with any issues or inadequate analgesia.  Reason for block:procedure for pain

## 2020-08-23 NOTE — Plan of Care (Signed)
Pt resting comfortably.  Family attentive at bedside.

## 2020-08-24 LAB — CBC
HCT: 25.1 % — ABNORMAL LOW (ref 36.0–46.0)
Hemoglobin: 9.2 g/dL — ABNORMAL LOW (ref 12.0–15.0)
MCH: 33.2 pg (ref 26.0–34.0)
MCHC: 36.7 g/dL — ABNORMAL HIGH (ref 30.0–36.0)
MCV: 90.6 fL (ref 80.0–100.0)
Platelets: 151 10*3/uL (ref 150–400)
RBC: 2.77 MIL/uL — ABNORMAL LOW (ref 3.87–5.11)
RDW: 13 % (ref 11.5–15.5)
WBC: 22.2 10*3/uL — ABNORMAL HIGH (ref 4.0–10.5)
nRBC: 0 % (ref 0.0–0.2)

## 2020-08-24 LAB — COMPREHENSIVE METABOLIC PANEL
ALT: 21 U/L (ref 0–44)
AST: 42 U/L — ABNORMAL HIGH (ref 15–41)
Albumin: 2.3 g/dL — ABNORMAL LOW (ref 3.5–5.0)
Alkaline Phosphatase: 115 U/L (ref 38–126)
Anion gap: 7 (ref 5–15)
BUN: 20 mg/dL (ref 6–20)
CO2: 23 mmol/L (ref 22–32)
Calcium: 6.8 mg/dL — ABNORMAL LOW (ref 8.9–10.3)
Chloride: 102 mmol/L (ref 98–111)
Creatinine, Ser: 1.55 mg/dL — ABNORMAL HIGH (ref 0.44–1.00)
GFR, Estimated: 47 mL/min — ABNORMAL LOW (ref >60)
Glucose, Bld: 118 mg/dL — ABNORMAL HIGH (ref 70–99)
Potassium: 4.4 mmol/L (ref 3.5–5.1)
Sodium: 132 mmol/L — ABNORMAL LOW (ref 135–145)
Total Bilirubin: 0.3 mg/dL (ref 0.3–1.2)
Total Protein: 4.7 g/dL — ABNORMAL LOW (ref 6.5–8.1)

## 2020-08-24 LAB — MAGNESIUM: Magnesium: 5.9 mg/dL — ABNORMAL HIGH (ref 1.7–2.4)

## 2020-08-24 MED ORDER — RHO D IMMUNE GLOBULIN 1500 UNIT/2ML IJ SOSY
300.0000 ug | PREFILLED_SYRINGE | Freq: Once | INTRAMUSCULAR | Status: AC
  Administered 2020-08-24: 300 ug via INTRAVENOUS
  Filled 2020-08-24: qty 2

## 2020-08-24 MED ORDER — LACTATED RINGERS IV SOLN
INTRAVENOUS | Status: DC
  Administered 2020-08-24: 100 mL/h via INTRAVENOUS

## 2020-08-24 MED ORDER — NIFEDIPINE ER OSMOTIC RELEASE 30 MG PO TB24: 30.0000 mg | ORAL_TABLET | Freq: Every day | ORAL | Status: DC

## 2020-08-24 NOTE — Anesthesia Postprocedure Evaluation (Signed)
Anesthesia Post Note  Patient: Joan Sanchez  Procedure(s) Performed: AN AD HOC LABOR EPIDURAL     Patient location during evaluation: Mother Baby Anesthesia Type: Epidural Level of consciousness: awake and alert, oriented and patient cooperative Pain management: pain level controlled Vital Signs Assessment: post-procedure vital signs reviewed and stable Respiratory status: spontaneous breathing Cardiovascular status: stable Postop Assessment: no headache, epidural receding, patient able to bend at knees and no signs of nausea or vomiting Anesthetic complications: no Comments: Pt. States she is walking.  Pain score 0.    No complications documented.  Last Vitals:  Vitals:   08/24/20 0655 08/24/20 0811  BP: 132/83 (!) 141/74  Pulse: (!) 58 (!) 59  Resp: 16 16  Temp: 36.8 C 36.9 C  SpO2: 99% 99%    Last Pain:  Vitals:   08/24/20 0811  TempSrc: Oral  PainSc:    Pain Goal: Patients Stated Pain Goal: 3 (08/24/20 0655)                 [name]

## 2020-08-24 NOTE — Progress Notes (Signed)
Patient is eating, ambulating, voiding.  Pain control is good.  Appropriate lochia, no complaints.  Denies CP/SOB, HA, calf tenderness.  Vitals:   08/24/20 0630 08/24/20 0655 08/24/20 0811 08/24/20 1135  BP:  132/83 (!) 141/74 (!) 149/84  Pulse:  (!) 58 (!) 59 61  Resp: 16 16 16 16   Temp:  98.3 F (36.8 C) 98.4 F (36.9 C) 98.4 F (36.9 C)  TempSrc:  Oral Oral Oral  SpO2: 99% 99% 99% 100%  Weight:      Height:        Fundus firm Ext: no CT  Lab Results  Component Value Date   WBC 22.2 (H) 08/24/2020   HGB 9.2 (L) 08/24/2020   HCT 25.1 (L) 08/24/2020   MCV 90.6 08/24/2020   PLT 151 08/24/2020    --/--/O NEG (03/18 0423)/  A/P Post partum day 1 BPs normal to mild range.  MgSO4 ordered to discontinue tonight 24 hrs after delivery, will continue to monitor Recent Mag level therapeutic at 5.9 Mild renal insufficiency with continued trend up, yesterday 1.43, today 1.55, UOP >100cc per hour, will recheck Cr in am  Routine care.  03-12-1975

## 2020-08-24 NOTE — Lactation Note (Addendum)
This note was copied from a baby's chart. Lactation Consultation Note  Patient Name: Joan Sanchez GYJEH'U Date: 08/24/2020 Reason for consult: Follow-up assessment;Late-preterm 34-36.6wks;Infant < 6lbs;1st time breastfeeding Age:26 hours   P1, Baby [redacted]w[redacted]d.  <  6 lbs.  Baby is breastfeeding and being supplementing with donor milk and maternal sister's breastmilk.  Waiver signed. Observed feeding with frequent swallows for 20 min with lips flanged. Reviewed LPI feeding guidelines. Baby took 10 ml of donor milk with slow flow nipple. Recommend mother pump every other feeding if baby is feeding well like she did with this feeding otherwise q 3 hours.   Maternal Data Has patient been taught Hand Expression?: Yes Does the patient have breastfeeding experience prior to this delivery?: No  Feeding Mother's Current Feeding Choice: Breast Milk and Donor Milk  LATCH Score Latch: Grasps breast easily, tongue down, lips flanged, rhythmical sucking.  Audible Swallowing: Spontaneous and intermittent  Type of Nipple: Everted at rest and after stimulation  Comfort (Breast/Nipple): Soft / non-tender  Hold (Positioning): Assistance needed to correctly position infant at breast and maintain latch.  LATCH Score: 9   Lactation Tools Discussed/Used  [name] RN will review DEBP use with mother.  Interventions Interventions: Breast feeding basics reviewed;Assisted with latch;Skin to skin;Hand express;DEBP;Education;Position options;Adjust position;Support pillows  Discharge Pump: DEBP  Consult Status Consult Status: Follow-up Date: 08/25/20 Follow-up type: In-patient   Dahlia Byes [facility] 08/24/2020, 8:48 AM

## 2020-08-25 ENCOUNTER — Encounter (HOSPITAL_COMMUNITY): Payer: Self-pay | Admitting: Obstetrics and Gynecology

## 2020-08-25 LAB — COMPREHENSIVE METABOLIC PANEL
ALT: 23 U/L (ref 0–44)
AST: 43 U/L — ABNORMAL HIGH (ref 15–41)
Albumin: 2.3 g/dL — ABNORMAL LOW (ref 3.5–5.0)
Alkaline Phosphatase: 97 U/L (ref 38–126)
Anion gap: 5 (ref 5–15)
BUN: 18 mg/dL (ref 6–20)
CO2: 26 mmol/L (ref 22–32)
Calcium: 7.1 mg/dL — ABNORMAL LOW (ref 8.9–10.3)
Chloride: 105 mmol/L (ref 98–111)
Creatinine, Ser: 1.15 mg/dL — ABNORMAL HIGH (ref 0.44–1.00)
GFR, Estimated: >60 mL/min (ref >60)
Glucose, Bld: 87 mg/dL (ref 70–99)
Potassium: 4.5 mmol/L (ref 3.5–5.1)
Sodium: 136 mmol/L (ref 135–145)
Total Bilirubin: 0.7 mg/dL (ref 0.3–1.2)
Total Protein: 4.6 g/dL — ABNORMAL LOW (ref 6.5–8.1)

## 2020-08-25 LAB — CBC
HCT: 23.2 % — ABNORMAL LOW (ref 36.0–46.0)
Hemoglobin: 8 g/dL — ABNORMAL LOW (ref 12.0–15.0)
MCH: 31.9 pg (ref 26.0–34.0)
MCHC: 34.5 g/dL (ref 30.0–36.0)
MCV: 92.4 fL (ref 80.0–100.0)
Platelets: 140 10*3/uL — ABNORMAL LOW (ref 150–400)
RBC: 2.51 MIL/uL — ABNORMAL LOW (ref 3.87–5.11)
RDW: 13.2 % (ref 11.5–15.5)
WBC: 19.1 10*3/uL — ABNORMAL HIGH (ref 4.0–10.5)
nRBC: 0.4 % — ABNORMAL HIGH (ref 0.0–0.2)

## 2020-08-25 LAB — RH IG WORKUP (INCLUDES ABO/RH)
ABO/RH(D): O NEG
Fetal Screen: NEGATIVE
Gestational Age(Wks): 36
Unit division: 0

## 2020-08-25 MED ORDER — NIFEDIPINE ER OSMOTIC RELEASE 30 MG PO TB24
60.0000 mg | ORAL_TABLET | Freq: Every day | ORAL | Status: DC
  Administered 2020-08-26 – 2020-08-28 (×3): 60 mg via ORAL
  Filled 2020-08-25 (×3): qty 2

## 2020-08-25 MED ORDER — NIFEDIPINE ER OSMOTIC RELEASE 30 MG PO TB24
30.0000 mg | ORAL_TABLET | Freq: Once | ORAL | Status: AC
  Administered 2020-08-25: 30 mg via ORAL
  Filled 2020-08-25: qty 1

## 2020-08-25 MED ORDER — FERROUS GLUCONATE 324 (38 FE) MG PO TABS
324.0000 mg | ORAL_TABLET | Freq: Two times a day (BID) | ORAL | Status: DC
  Administered 2020-08-25 – 2020-08-28 (×6): 324 mg via ORAL
  Filled 2020-08-25 (×6): qty 1

## 2020-08-25 MED ORDER — NIFEDIPINE ER OSMOTIC RELEASE 30 MG PO TB24
30.0000 mg | ORAL_TABLET | Freq: Every day | ORAL | Status: DC
  Administered 2020-08-25: 30 mg via ORAL
  Filled 2020-08-25: qty 1

## 2020-08-25 NOTE — Progress Notes (Signed)
Patient is eating, ambulating, voiding.  Pain control is good. Appropriate lochia, no complaints.  Denies CP/SOB, HA, calf tenderness.  Vitals:   08/25/20 0030 08/25/20 0045 08/25/20 0355 08/25/20 0747  BP: (!) 162/86 (!) 149/91 (!) 151/93 (!) 159/93  Pulse: (!) 49 60 (!) 58 (!) 57  Resp: 18  18 18   Temp: 98.6 F (37 C)  98.8 F (37.1 C) 98.5 F (36.9 C)  TempSrc: Oral  Oral Oral  SpO2: 100%  100% 99%  Weight:      Height:        Fundus firm Ext: no calf tenderness  Lab Results  Component Value Date   WBC 19.1 (H) 08/25/2020   HGB 8.0 (L) 08/25/2020   HCT 23.2 (L) 08/25/2020   MCV 92.4 08/25/2020   PLT 140 (L) 08/25/2020    --/--/O NEG (03/18 0423)  A/P Post partum day 2 Severe range BP overnight, responded to IV labetalol x 20mg .  Advised RN to start Procardia XL 30mg  if add'l severe range.  There was an additional severe range at 0030, but Procardia was not started, spoke with RN this am and advised to go ahead and administer dose given high mild range BPs are persistent.   Labs: Cr improved to 1.15, good UOP Acute blood loss anemia from vaginal delivery.  Hb from 9.2 to 8.0 will start Fe bid Plts mildly depressed at 140.   Will recheck labs in am and continue to monitor BPs today and overnight.  Routine care.   03-12-1975

## 2020-08-25 NOTE — Lactation Note (Signed)
This note was copied from a baby's chart. Lactation Consultation Note  Patient Name: Joan Sanchez GUYQI'H Date: 08/25/2020 Reason for consult: Follow-up assessment;Primapara;1st time breastfeeding;Late-preterm 34-36.6wks;Infant < 6lbs Age:26 hours  0820 - 0845 - I conducted a discharge lactation consult with Ms. [name] and her support person. She was breast feeding upon entry in cradle hold on the left breast. Rhythmic suckling sequences noted. Ms. [name] admitted to some breast tenderness, but when baby released the breast, the nipple was round and in tact with normal color.   I offered to help with latching baby to the right breast. I recommended STS. We placed baby in cradle hold. She latched readily with little assistance.   I educated on day 2 infant feeding patterns, when to expect mature milk to transition, the purpose of using the DEBP (even when not much milk is being expressed) and signs that baby is getting enough to eat.  Ms. [name] asked questions about strategies to improve milk production. I recommended breast feeding on demand 8-12 times a day and to offer both breasts in a feeding. I recommended that she continue to post pump some throughout the day for stimulation, and as her milk volume increases, she can supplement with her EBM.  Parents used breast milk of Ms. [name] sister but are running out. They request DBM today.   Ms. [name] will follow up with [facility]. I reviewed our community breast feeding resources and recommended that she follow up with the IBCLC at her pediatrician's office or with the [facility] in OP setting.  Maternal Data Has patient been taught Hand Expression?: Yes Does the patient have breastfeeding experience prior to this delivery?: No  Feeding Mother's Current Feeding Choice: Breast Milk and Donor Milk  LATCH Score Latch: Grasps breast easily, tongue down, lips flanged, rhythmical sucking.  Audible Swallowing: Spontaneous and  intermittent  Type of Nipple: Everted at rest and after stimulation  Comfort (Breast/Nipple): Soft / non-tender  Hold (Positioning): Assistance needed to correctly position infant at breast and maintain latch.  LATCH Score: 9   Lactation Tools Discussed/Used Tools: Comfort gels Breast pump type: Double-Electric Breast Pump Pump Education: Setup, frequency, and cleaning Reason for Pumping: stimulation - LPI Pumping frequency:  (sporadic) Pumped volume:  (droplets)  Interventions Interventions: Breast feeding basics reviewed;Assisted with latch;Skin to skin;Hand express;Adjust position;Comfort gels;Education  Discharge Discharge Education: Engorgement and breast care;Warning signs for feeding baby;Outpatient recommendation ([facility] at [facility]) Pump: DEBP WIC Program: No  Consult Status Consult Status: Complete Date: 08/25/20 Follow-up type: In-patient    Walker Shadow 08/25/2020, 8:47 AM

## 2020-08-25 NOTE — Progress Notes (Signed)
RN called to report high mild range BP.  Advised to add additional Procardia XL 30mg  and will increase tomorrow am dose to Procardia XL 60mg  qd.

## 2020-08-26 LAB — CBC
HCT: 28.4 % — ABNORMAL LOW (ref 36.0–46.0)
Hemoglobin: 9.8 g/dL — ABNORMAL LOW (ref 12.0–15.0)
MCH: 31.7 pg (ref 26.0–34.0)
MCHC: 34.5 g/dL (ref 30.0–36.0)
MCV: 91.9 fL (ref 80.0–100.0)
Platelets: 201 10*3/uL (ref 150–400)
RBC: 3.09 MIL/uL — ABNORMAL LOW (ref 3.87–5.11)
RDW: 13.2 % (ref 11.5–15.5)
WBC: 21.7 10*3/uL — ABNORMAL HIGH (ref 4.0–10.5)
nRBC: 0.3 % — ABNORMAL HIGH (ref 0.0–0.2)

## 2020-08-26 LAB — COMPREHENSIVE METABOLIC PANEL
ALT: 28 U/L (ref 0–44)
AST: 49 U/L — ABNORMAL HIGH (ref 15–41)
Albumin: 2.9 g/dL — ABNORMAL LOW (ref 3.5–5.0)
Alkaline Phosphatase: 121 U/L (ref 38–126)
Anion gap: 8 (ref 5–15)
BUN: 10 mg/dL (ref 6–20)
CO2: 24 mmol/L (ref 22–32)
Calcium: 8.7 mg/dL — ABNORMAL LOW (ref 8.9–10.3)
Chloride: 100 mmol/L (ref 98–111)
Creatinine, Ser: 0.94 mg/dL (ref 0.44–1.00)
GFR, Estimated: >60 mL/min (ref >60)
Glucose, Bld: 83 mg/dL (ref 70–99)
Potassium: 4.3 mmol/L (ref 3.5–5.1)
Sodium: 132 mmol/L — ABNORMAL LOW (ref 135–145)
Total Bilirubin: 0.7 mg/dL (ref 0.3–1.2)
Total Protein: 5.7 g/dL — ABNORMAL LOW (ref 6.5–8.1)

## 2020-08-26 MED ORDER — LABETALOL HCL 200 MG PO TABS
200.0000 mg | ORAL_TABLET | Freq: Three times a day (TID) | ORAL | Status: DC
  Administered 2020-08-26 (×2): 200 mg via ORAL
  Filled 2020-08-26 (×2): qty 1

## 2020-08-26 MED ORDER — LABETALOL HCL 200 MG PO TABS
300.0000 mg | ORAL_TABLET | Freq: Three times a day (TID) | ORAL | Status: DC
  Administered 2020-08-26 – 2020-08-27 (×2): 300 mg via ORAL
  Filled 2020-08-26 (×2): qty 1

## 2020-08-26 NOTE — Lactation Note (Signed)
This note was copied from a baby's chart. Lactation Consultation Note  Patient Name: Joan Sanchez ALPFX'T Date: 08/26/2020   Age:26 hours  I called and spoke with K. Ericka Pontiff, RN. She said that parents are using yellow slow-flow nipple for bottle feedings. Aurelio Brash, IBCLC shared that parents had mentioned that infant drinks from the bottle very quickly (parents reported that they had been taught how to do paced bottle-feeding).   I asked RN to take in the Enfamil extra-slow flow nipples to ensure safe swallowing.    Lurline Hare [facility] 08/26/2020, 3:34 PM

## 2020-08-26 NOTE — Progress Notes (Signed)
Patient is eating, ambulating, voiding.  Pain control is good. Appropriate lochia, has felt intermittent dizzyness.  No other complaints.  Vitals:   08/26/20 0522 08/26/20 0736 08/26/20 1023 08/26/20 1122  BP: (!) 145/106 (!) 146/105 (!) 175/106 (!) 139/96  Pulse: 78 (!) 58 74 77  Resp:  18  18  Temp:  98.2 F (36.8 C)  98 F (36.7 C)  TempSrc:  Oral  Oral  SpO2:  100%  100%  Weight:      Height:        Fundus firm Ext: no calf tenderness  Lab Results  Component Value Date   WBC 21.7 (H) 08/26/2020   HGB 9.8 (L) 08/26/2020   HCT 28.4 (L) 08/26/2020   MCV 91.9 08/26/2020   PLT 201 08/26/2020    --/--/O NEG (03/18 0423)  A/P Post partum day 3 s/p IOL for severe PEC Procardia XL increased this morning to 60mg  qd and labetalol 200mg  tid also added.  BPs have been labile most recently 139/96.  Labs have been essentially normal with AST slightly up at 49, ALT wnl, plt wnl and Cr now normalized.  Will recheck tomorrow given persistent severe BPs needed add'l medication.  Routine care.    

## 2020-08-26 NOTE — Progress Notes (Signed)
Pt sitting up in bed and pumping.  Reports last feeding infant at around 1830.  Infant breastfeeding and taking expressed breast milk.  Pt encouraged to call nurse if help with feeding is needed.  Pt verbalizes understanding.

## 2020-08-26 NOTE — Lactation Note (Signed)
This note was copied from a baby's chart. Lactation Consultation Note  Patient Name: Joan Sanchez Date: 08/26/2020 Reason for consult: Follow-up assessment;Late-preterm 34-36.6wks;Primapara;Infant < 6lbs (bili increasing, +DAT,) Age:26 hours   Infant sleeping on mom's chest.  Mom recently breastfed and supplemented.    Mom has sore nipples,  Compression stripe noted on left nipple.  Mom pumped once yesterday.  She is currently BF approx. Every 2 hours then supplementing with DM.  Mom is not feeling very well due to increased BP.  Infant Bili has increased and is at 13.1 total this morning.    Mom's breasts are filling.  Upon assessment, breasts feel slightly full but not hard at all.  Mom complains only of nipple discomfort; not breast pain.  Mom and dad feel infant is feeding very well.  She BF 15 minutes then they supplement her with a bottle of EBM which takes approx. 1 minute per dad.  LC suggested allowing infant to BF 5-10 minutes longer if she is actively sucking and swallowing when feeding,  then supplementing afterwards, keeping feedings under 30 minutes total, (BF and supplementation).  This provides more stimulation for mom's supply if in the case she is feeling too unwell to pump throughout the day today.    LC encouraged mom to pump after breastfeeding, goal of 4-6 times today,  in order to collect EBM to feed back to infant.  Mom denies discomfort with the pump.    LC explained increased bili correlating with increased sleepiness.   Family was encouraged to call out for further questions or concerns.  BF basics reviewed. Comfort gels provided and use explained.    Maternal Data    Feeding Mother's Current Feeding Choice: Breast Milk and Donor Milk Nipple Type: Slow - flow  LATCH Score                    Lactation Tools Discussed/Used Tools: Coconut oil;Comfort gels (Mom understands not to use in conjunction with one another (coconut oil and  gels)) Breast pump type: Double-Electric Breast Pump Reason for Pumping: LPI, stimulation, provide mom's milk for supplementation  Interventions Interventions: Breast feeding basics reviewed;Comfort gels  Discharge Discharge Education: Engorgement and breast care  Consult Status Consult Status: Follow-up Date: 08/26/20    [name] [facility] 08/26/2020, 8:26 AM

## 2020-08-26 NOTE — Lactation Note (Signed)
This note was copied from a baby's chart. Lactation Consultation Note  Patient Name: Girl Ashante Yellin IOEVO'J Date: 08/26/2020   Age:26 hours   Mom resting when LC entered room.  LC was hoping to observe a BF for a latch assessment but mom recently fed and infant was asleep.  Mom had recently pumped 30 ml and her EBM was in the refrigerator. LC praised mom for pumping and BF.  Mom feels infant is feeding well and she can hear swallows.     LC emphasized importance of continuing to supplement infant after the breastfeed.  LPTI behavior reviewed supplementation guidelines discussed.  Mom understands due to jaundice, small size, and early gestation, it's important to continue to pump and supplement her afterwards.    Mom was reminded to keep feedings under 30, (BF and supplementation).    Maternal Data    Feeding    LATCH Score                    Lactation Tools Discussed/Used    Interventions    Discharge    Consult Status      [name] [facility] 08/26/2020, 3:36 PM

## 2020-08-27 LAB — COMPREHENSIVE METABOLIC PANEL
ALT: 59 U/L — ABNORMAL HIGH (ref 0–44)
ALT: 80 U/L — ABNORMAL HIGH (ref 0–44)
AST: 83 U/L — ABNORMAL HIGH (ref 15–41)
AST: 107 U/L — ABNORMAL HIGH (ref 15–41)
Albumin: 2.7 g/dL — ABNORMAL LOW (ref 3.5–5.0)
Albumin: 2.7 g/dL — ABNORMAL LOW (ref 3.5–5.0)
Alkaline Phosphatase: 104 U/L (ref 38–126)
Alkaline Phosphatase: 89 U/L (ref 38–126)
Anion gap: 6 (ref 5–15)
Anion gap: 6 (ref 5–15)
BUN: 10 mg/dL (ref 6–20)
BUN: 10 mg/dL (ref 6–20)
CO2: 24 mmol/L (ref 22–32)
CO2: 25 mmol/L (ref 22–32)
Calcium: 8.5 mg/dL — ABNORMAL LOW (ref 8.9–10.3)
Calcium: 8.3 mg/dL — ABNORMAL LOW (ref 8.9–10.3)
Chloride: 104 mmol/L (ref 98–111)
Chloride: 105 mmol/L (ref 98–111)
Creatinine, Ser: 0.89 mg/dL (ref 0.44–1.00)
Creatinine, Ser: 0.9 mg/dL (ref 0.44–1.00)
GFR, Estimated: >60 mL/min (ref >60)
GFR, Estimated: >60 mL/min (ref >60)
Glucose, Bld: 95 mg/dL (ref 70–99)
Glucose, Bld: 101 mg/dL — ABNORMAL HIGH (ref 70–99)
Potassium: 4.1 mmol/L (ref 3.5–5.1)
Potassium: 4.2 mmol/L (ref 3.5–5.1)
Sodium: 134 mmol/L — ABNORMAL LOW (ref 135–145)
Sodium: 136 mmol/L (ref 135–145)
Total Bilirubin: 0.7 mg/dL (ref 0.3–1.2)
Total Bilirubin: 0.5 mg/dL (ref 0.3–1.2)
Total Protein: 5.6 g/dL — ABNORMAL LOW (ref 6.5–8.1)
Total Protein: 5.6 g/dL — ABNORMAL LOW (ref 6.5–8.1)

## 2020-08-27 LAB — CBC
HCT: 25.9 % — ABNORMAL LOW (ref 36.0–46.0)
HCT: 26.1 % — ABNORMAL LOW (ref 36.0–46.0)
Hemoglobin: 8.9 g/dL — ABNORMAL LOW (ref 12.0–15.0)
Hemoglobin: 9.3 g/dL — ABNORMAL LOW (ref 12.0–15.0)
MCH: 31.9 pg (ref 26.0–34.0)
MCH: 33 pg (ref 26.0–34.0)
MCHC: 34.4 g/dL (ref 30.0–36.0)
MCHC: 35.6 g/dL (ref 30.0–36.0)
MCV: 92.8 fL (ref 80.0–100.0)
MCV: 92.6 fL (ref 80.0–100.0)
Platelets: 196 10*3/uL (ref 150–400)
Platelets: 223 10*3/uL (ref 150–400)
RBC: 2.79 MIL/uL — ABNORMAL LOW (ref 3.87–5.11)
RBC: 2.82 MIL/uL — ABNORMAL LOW (ref 3.87–5.11)
RDW: 13.1 % (ref 11.5–15.5)
RDW: 13.3 % (ref 11.5–15.5)
WBC: 16.2 10*3/uL — ABNORMAL HIGH (ref 4.0–10.5)
WBC: 15 10*3/uL — ABNORMAL HIGH (ref 4.0–10.5)
nRBC: 0 % (ref 0.0–0.2)
nRBC: 0 % (ref 0.0–0.2)

## 2020-08-27 MED ORDER — LABETALOL HCL 100 MG PO TABS
100.0000 mg | ORAL_TABLET | Freq: Once | ORAL | Status: AC
  Administered 2020-08-27: 100 mg via ORAL
  Filled 2020-08-27: qty 1

## 2020-08-27 MED ORDER — LABETALOL HCL 200 MG PO TABS
400.0000 mg | ORAL_TABLET | Freq: Three times a day (TID) | ORAL | Status: DC
Start: 2020-08-27 — End: 2020-08-28
  Administered 2020-08-27 – 2020-08-28 (×3): 400 mg via ORAL
  Filled 2020-08-27 (×3): qty 2

## 2020-08-27 NOTE — Progress Notes (Signed)
Post Partum Day 4 Subjective: no complaints, up ad lib, voiding, tolerating PO and denies HA/VC/RUQ pain/SOB  Objective: Patient Vitals for the past 24 hrs:  BP Temp Temp src Pulse Resp SpO2  08/27/20 0927 (!) 156/100 98.8 F (37.1 C) Oral 79 18 100 %  08/27/20 0729 (!) 138/99 98.9 F (37.2 C) Oral 89 18 100 %  08/27/20 0420 (!) 135/95 98.5 F (36.9 C) Oral 71 18 99 %  08/26/20 2150 (!) 134/98 98.4 F (36.9 C) Oral 67 17 100 %  08/26/20 2030 (!) 136/95 98.6 F (37 C) Oral 89 18 100 %  08/26/20 1556 (!) 153/100 99 F (37.2 C) Oral 85 18 100 %    Physical Exam:  General: alert Lochia: appropriate Uterine Fundus: firm DVT Evaluation: No evidence of DVT seen on physical exam.  Recent Labs    08/25/20 0417 08/26/20 0520 08/27/20 0456  WBC 19.1* 21.7* 16.2*  HGB 8.0* 9.8* 8.9*  HCT 23.2* 28.4* 25.9*  PLT 140* 201 196    Recent Labs    08/25/20 0417 08/26/20 0520 08/27/20 0456  NA 136 132* 134*  K 4.5 4.3 4.1  CL 105 100 104  BUN 18 10 10   CREATININE 1.15* 0.94 0.89  GLUCOSE 87 83 95  BILITOT 0.7 0.7 0.7  ALT 23 28 59*  AST 43* 49* 83*  ALKPHOS 97 121 104  PROT 4.6* 5.7* 5.6*  ALBUMIN 2.3* 2.9* 2.7*    Recent Labs    08/25/20 0417 08/26/20 0520 08/27/20 0456  CALCIUM 7.1* 8.7* 8.5*    Assessment/Plan: 08/29/20 24 y.o. G1P0101 PPD#4 sp SVD at [redacted]w[redacted]d for severe preeclampsia. Continued inpatient management for BP/preeclampsia monitoring 1. PPC: EBL 776cc, 2nd degree repaired. Given TXA prophylactically at delivery 2. Severe preeclampsia: -Mild range Bps, now on procardia 60mg  QD, labetalol 300mg  TID, increase to 400mg  TID today -Labs: plt and cr improved. ALT/AST increased, now 59/83. Hold tylenol. Recheck labs this afternoon -Currently asympmtomatic -S/p 24h postpartum magnesium 3. Iron deficiency anemia and ABLA: hgb stable 8 postpartum. Plan PO iron at discharge 4. Rh neg: s/p rhogam in pregnancy. Baby O+. Received rhogam postpartum 5.  Vaccines: s/p tdap in pregnancy. Declined flu, COVID vaccines Rubella immune, blood type O NEG, breastfeeding, baby girl in room   LOS: 5 days   Sinead Hockman K Taam-Akelman 08/27/2020, 11:32 AM

## 2020-08-27 NOTE — Lactation Note (Signed)
This note was copied from a baby's chart. Lactation Consultation Note  Patient Name: Joan Sanchez [id number] Date: 08/27/2020 Reason for consult: Follow-up assessment;Late-preterm 34-36.6wks;Infant < 6lbs Age:26 days  P1, Phototherapy discontinued.  Mother has abrasions on tips of nipples.  Noted short mid anterior lingual frenulum.   If soreness continues and does not improve, suggest discussing with Peds MD and IBCLC at Pediatric office. Stools have transitioned to brown and seedy.  Pumping good volume 53 ml.  If mother can tolerate, suggest breastfeeding and then supplement after with 30 ml +.   Discussed pumping 4-6 times per day versus every feeding.    Feeding Mother's Current Feeding Choice: Breast Milk   Lactation Tools Discussed/Used Tools: Comfort gels;Coconut oil;Pump Breast pump type: Double-Electric Breast Pump Pumping frequency: 4-6x/day Pumped volume: 53 mL  Interventions Interventions: Breast feeding basics reviewed;DEBP;Education  Discharge Discharge Education: Engorgement and breast care;Warning signs for feeding baby;Outpatient recommendation (Recommend Lactation at Pediatric office)  Consult Status Consult Status: Complete Date: 08/27/20    Joan Sanchez [facility] 08/27/2020, 9:35 AM

## 2020-08-27 NOTE — Progress Notes (Signed)
OBGYN Note S: patient reports feeling fine, wants to go home. Denies HA/VC/RUQ pain Vitals:   08/27/20 0927 08/27/20 1135 08/27/20 1307 08/27/20 1512  BP: (!) 156/100 (!) 140/94 126/83 127/84  Pulse: 79 80 74 71  Resp: 18 18 18 16   Temp: 98.8 F (37.1 C)   98.6 F (37 C)  TempSrc: Oral   Oral  SpO2: 100% 99% 100% 99%  Weight:      Height:       Recent Labs    08/26/20 0520 08/27/20 0456 08/27/20 1349  WBC 21.7* 16.2* 15.0*  HGB 9.8* 8.9* 9.3*  HCT 28.4* 25.9* 26.1*  PLT 201 196 223  NA 132* 134* 136  K 4.3 4.1 4.2  CL 100 104 105  BUN 10 10 10   CREATININE 0.94 0.89 0.90  AST 49* 83* 107*  ALT 28 59* 80*  BILITOT 0.7 0.7 0.5    Janaki Parker 24 y.o. G1P0101 PPD#4 sp SVD at [redacted]w[redacted]d for severe preeclampsia. Continued inpatient management for BP/preeclampsia monitoring, plan to continue monitoring due to rising LFTs 1. Severe preeclampsia: -On procardia 60mg  QD, labetalol 300mg  TID, increased to 400mg  TID today, now well controlled -Labs: plt and cr improved. ALT/AST increased, this AM 59/83 > repeat 80/107. Has had minimal tylenol, held today. Discussed with patient rising LFTs, higher than previously (had early down trended), recommend inpatient monitoring, repeat labs in AM including acute hepatitis panel and LDH.  -Currently asympmtomatic -S/p 24h postpartum magnesium  [name] 08/27/20 7:12 PM

## 2020-08-28 LAB — COMPREHENSIVE METABOLIC PANEL WITH GFR
ALT: 77 U/L — ABNORMAL HIGH (ref 0–44)
AST: 84 U/L — ABNORMAL HIGH (ref 15–41)
Albumin: 2.7 g/dL — ABNORMAL LOW (ref 3.5–5.0)
Alkaline Phosphatase: 103 U/L (ref 38–126)
Anion gap: 6 (ref 5–15)
BUN: 10 mg/dL (ref 6–20)
CO2: 21 mmol/L — ABNORMAL LOW (ref 22–32)
Calcium: 8.6 mg/dL — ABNORMAL LOW (ref 8.9–10.3)
Chloride: 107 mmol/L (ref 98–111)
Creatinine, Ser: 0.85 mg/dL (ref 0.44–1.00)
GFR, Estimated: >60 mL/min
Glucose, Bld: 108 mg/dL — ABNORMAL HIGH (ref 70–99)
Potassium: 4.2 mmol/L (ref 3.5–5.1)
Sodium: 134 mmol/L — ABNORMAL LOW (ref 135–145)
Total Bilirubin: 0.5 mg/dL (ref 0.3–1.2)
Total Protein: 5.7 g/dL — ABNORMAL LOW (ref 6.5–8.1)

## 2020-08-28 LAB — CBC
HCT: 25.9 % — ABNORMAL LOW (ref 36.0–46.0)
Hemoglobin: 8.7 g/dL — ABNORMAL LOW (ref 12.0–15.0)
MCH: 31.4 pg (ref 26.0–34.0)
MCHC: 33.6 g/dL (ref 30.0–36.0)
MCV: 93.5 fL (ref 80.0–100.0)
Platelets: 210 10*3/uL (ref 150–400)
RBC: 2.77 MIL/uL — ABNORMAL LOW (ref 3.87–5.11)
RDW: 13.4 % (ref 11.5–15.5)
WBC: 14 10*3/uL — ABNORMAL HIGH (ref 4.0–10.5)
nRBC: 0 % (ref 0.0–0.2)

## 2020-08-28 LAB — HEPATITIS PANEL, ACUTE
HCV Ab: NONREACTIVE
Hep A IgM: NONREACTIVE
Hep B C IgM: NONREACTIVE
Hepatitis B Surface Ag: NONREACTIVE

## 2020-08-28 LAB — LACTATE DEHYDROGENASE: LDH: 187 U/L (ref 98–192)

## 2020-08-28 MED ORDER — IBUPROFEN 600 MG PO TABS: 600.0000 mg | ORAL_TABLET | Freq: Four times a day (QID) | ORAL | 0 refills | Status: DC | PRN

## 2020-08-28 MED ORDER — NIFEDIPINE ER 60 MG PO TB24: 60.0000 mg | ORAL_TABLET | Freq: Every day | ORAL | 1 refills | Status: DC

## 2020-08-28 MED ORDER — LABETALOL HCL 200 MG PO TABS: 400.0000 mg | ORAL_TABLET | Freq: Three times a day (TID) | ORAL | 1 refills | Status: DC

## 2020-08-28 NOTE — Discharge Summary (Signed)
Postpartum Discharge Summary      Patient Name: Joan Sanchez DOB: 05/16/1996 MRN: [id number]  Date of admission: 08/22/2020 Delivery date:08/23/2020  Delivering provider: Waynard Reeds  Date of discharge: 08/28/2020  Admitting diagnosis: Severe preeclampsia [O14.10] Severe pre-eclampsia [O14.10] Intrauterine pregnancy: [redacted]w[redacted]d     Secondary diagnosis:  Active Problems:   Severe preeclampsia   Severe pre-eclampsia  Additional problems: Indicated Preterm delivery    Discharge diagnosis: Preterm Pregnancy Delivered and Preeclampsia (severe)                                              Post partum procedures: Magnesium Sulfate, Antihypertensive medications Augmentation: AROM and Pitocin Complications: None  Hospital course: Induction of Labor With Vaginal Delivery   25 y.o. yo G1P0101 at [redacted]w[redacted]d was admitted to the hospital 08/22/2020 for induction of labor.  Indication for induction: Preeclampsia.  Patient had an uncomplicated labor course as follows: Membrane Rupture Time/Date: 9:24 AM ,08/23/2020   Delivery Method:Vaginal, Spontaneous  Episiotomy: None  Lacerations:  2nd degree;Perineal  Details of delivery can be found in separate delivery note.  Patient had a routine postpartum course. Patient is discharged home 08/28/20.  Newborn Data: Birth date:08/23/2020  Birth time:7:52 PM  Gender:Female  Living status:Living  Apgars:8 ,9  Weight:2690 g   Magnesium Sulfate received: Yes: Seizure prophylaxis BMZ received: Yes Rhophylac:Yes  Physical exam  Vitals:   08/27/20 1928 08/27/20 2305 08/28/20 0306 08/28/20 0759  BP: 136/85 (!) 150/94 133/89 130/81  Pulse: 72 68 66 73  Resp: 17 18 17 18   Temp: 97.8 F (36.6 C) 98.1 F (36.7 C) 97.8 F (36.6 C) 98.2 F (36.8 C)  TempSrc: Oral Oral Oral Oral  SpO2: 100% 100% 100% 100%  Weight:      Height:       General: alert, cooperative and no distress Lochia: appropriate Uterine Fundus: firm Incision: Healing well with no  significant drainage DVT Evaluation: No evidence of DVT seen on physical exam. Labs: Lab Results  Component Value Date   WBC 14.0 (H) 08/28/2020   HGB 8.7 (L) 08/28/2020   HCT 25.9 (L) 08/28/2020   MCV 93.5 08/28/2020   PLT 210 08/28/2020   CMP Latest Ref Rng & Units 08/28/2020  Glucose 70 - 99 mg/dL 08/30/2020)  BUN 6 - 20 mg/dL 10  Creatinine 546(E - 7.03 mg/dL 5.00  Sodium 9.38 - 182 mmol/L 134(L)  Potassium 3.5 - 5.1 mmol/L 4.2  Chloride 98 - 111 mmol/L 107  CO2 22 - 32 mmol/L 21(L)  Calcium 8.9 - 10.3 mg/dL 993)  Total Protein 6.5 - 8.1 g/dL 7.1(I)  Total Bilirubin 0.3 - 1.2 mg/dL 0.5  Alkaline Phos 38 - 126 U/L 103  AST 15 - 41 U/L 84(H)  ALT 0 - 44 U/L 77(H)   9.6(V Score: Edinburgh Postnatal Depression Scale Screening Tool 08/25/2020  I have been able to laugh and see the funny side of things. 0  I have looked forward with enjoyment to things. 0  I have blamed myself unnecessarily when things went wrong. 1  I have been anxious or worried for no good reason. 2  I have felt scared or panicky for no good reason. 1  Things have been getting on top of me. 0  I have been so unhappy that I have had difficulty sleeping. 0  I have felt sad or  miserable. 0  I have been so unhappy that I have been crying. 1  The thought of harming myself has occurred to me. 0  Edinburgh Postnatal Depression Scale Total 5      After visit meds:  Allergies as of 08/28/2020   No Known Allergies     Medication List    STOP taking these medications   acetaminophen 500 MG tablet Commonly known as: TYLENOL   calcium carbonate 750 MG chewable tablet Commonly known as: TUMS EX   CALCIUM PO   ferrous sulfate 324 MG Tbec   OVER THE COUNTER MEDICATION     TAKE these medications   ibuprofen 600 MG tablet Commonly known as: ADVIL Take 1 tablet (600 mg total) by mouth every 6 (six) hours as needed.   labetalol 200 MG tablet Commonly known as: NORMODYNE Take 2 tablets (400 mg total) by  mouth 3 (three) times daily.   NIFEdipine 60 MG 24 hr tablet Commonly known as: ADALAT CC Take 1 tablet (60 mg total) by mouth daily.   prenatal multivitamin Tabs tablet Take 1 tablet by mouth daily at 12 noon.        Discharge home in stable condition Infant Feeding: Breast Infant Disposition:home with mother Discharge instruction: per After Visit Summary and Postpartum booklet. Activity: Advance as tolerated. Pelvic rest for 6 weeks.  Diet: routine diet Anticipated Birth Control: Unsure Postpartum Appointment:2-3 days Future Appointments:No future appointments. Follow up Visit:  Follow-up Information    Waynard Reeds, MD Follow up on 08/31/2020.   Specialty: Obstetrics and Gynecology Why: Follow up for BP check On Friday 3/25 @330  pm Contact information: [address] [place] [address] [place] [phone]                   08/28/2020 08/30/2020, MD

## 2020-08-28 NOTE — Progress Notes (Signed)
Discharge instructions and prescriptions given to pt. Discussed post vag delivery care, signs and symptoms to report to the MD, upcoming appointments, and meds. Pt verbalizes understanding and has no questions at this time. Pt discharged home from hospital with baby in stable condition.

## 2020-11-14 ENCOUNTER — Ambulatory Visit (INDEPENDENT_AMBULATORY_CARE_PROVIDER_SITE_OTHER): Payer: 59 | Admitting: Interventional Cardiology

## 2020-11-14 ENCOUNTER — Other Ambulatory Visit: Payer: Self-pay

## 2020-11-14 ENCOUNTER — Encounter: Payer: Self-pay | Admitting: Interventional Cardiology

## 2020-11-14 VITALS — BP 146/78 | HR 79 | Ht 61.5 in | Wt 107.4 lb

## 2020-11-14 DIAGNOSIS — I498 Other specified cardiac arrhythmias: Secondary | ICD-10-CM | POA: Diagnosis not present

## 2020-11-14 DIAGNOSIS — R030 Elevated blood-pressure reading, without diagnosis of hypertension: Secondary | ICD-10-CM

## 2020-11-14 NOTE — Patient Instructions (Signed)
Medication Instructions:  Your physician recommends that you continue on your current medications as directed. Please refer to the Current Medication list given to you today.  *If you need a refill on your cardiac medications before your next appointment, please call your pharmacy*   Lab Work: none If you have labs (blood work) drawn today and your tests are completely normal, you will receive your results only by: . MyChart Message (if you have MyChart) OR . A paper copy in the mail If you have any lab test that is abnormal or we need to change your treatment, we will call you to review the results.   Testing/Procedures: none   Follow-Up: At [facility], you and your health needs are our priority.  As part of our continuing mission to provide you with exceptional heart care, we have created designated Provider Care Teams.  These Care Teams include your primary Cardiologist (physician) and Advanced Practice Providers (APPs -  Physician Assistants and Nurse Practitioners) who all work together to provide you with the care you need, when you need it.  We recommend signing up for the patient portal called "MyChart".  Sign up information is provided on this After Visit Summary.  MyChart is used to connect with patients for Virtual Visits (Telemedicine).  Patients are able to view lab/test results, encounter notes, upcoming appointments, etc.  Non-urgent messages can be sent to your provider as well.   To learn more about what you can do with MyChart, go to [email or web address].    Your next appointment:   As needed  The format for your next appointment:   In Person  Provider:   You may see Dr Varanasi or one of the following Advanced Practice Providers on your designated Care Team:    Dayna Dunn, PA-C  Michele Lenze, PA-C    Other Instructions   

## 2020-11-14 NOTE — Progress Notes (Signed)
Cardiology Office Note   Date:  11/14/2020   ID:  Niel Hummer, DOB 08/03/1995, MRN [id number]  PCP:  Pcp, No    No chief complaint on file.  Elevated BP  Wt Readings from Last 3 Encounters:  11/14/20 107 lb 6.4 oz (48.7 kg)  08/22/20 133 lb 12.8 oz (60.7 kg)       History of Present Illness: Joan Sanchez is a 25 y.o. female who is being seen today for the evaluation of elevated BPO readings at the request of Rowland Lathe, MD.  [facility] had preeclampsia while recently pregnant, diagnosed at 6 weeks.  She gave birth in March 2022. BP improved after giving birth. While pregnant, BP was in the 140/90s.     She was on labetolol 400 mg TID, nifedipine 60 daily.  These medicines been stopped.  Home BP readings are typically 100/60 to 110/70.    Denies : Chest pain. Dizziness. Leg edema. Nitroglycerin use. Orthopnea. Palpitations. Paroxysmal nocturnal dyspnea. Shortness of breath. Syncope.   Walks 3+ miles/day.   Past Medical History:  Diagnosis Date  . Abnormal blood creatinine level   . Anxiety   . Family history of BRCA1 gene positive   . Family history of breast cancer   . Family history of ovarian cancer   . Medical history non-contributory   . Severe preeclampsia     Past Surgical History:  Procedure Laterality Date  . NO PAST SURGERIES       Current Outpatient Medications  Medication Sig Dispense Refill  . ibuprofen (ADVIL) 600 MG tablet Take 1 tablet (600 mg total) by mouth every 6 (six) hours as needed. 90 tablet 0   No current facility-administered medications for this visit.    Allergies:   Patient has no known allergies.    Social History:  The patient  reports that she has never smoked. She has never used smokeless tobacco. She reports previous alcohol use. She reports that she does not use drugs.   Family History:  The patient's family history includes Breast cancer (age of onset: 8) in her mother; Ovarian cancer in her maternal  great-grandmother and another family member.    ROS:  Please see the history of present illness.   Otherwise, review of systems are positive for difficulty sleeping due to newborn baby in the house.   All other systems are reviewed and negative.    PHYSICAL EXAM: VS:  BP (!) 146/78   Pulse 79   Ht 5' 1.5" (1.562 m)   Wt 107 lb 6.4 oz (48.7 kg)   LMP  (Approximate)   SpO2 98%   BMI 19.96 kg/m  , BMI Body mass index is 19.96 kg/m. GEN: Well nourished, well developed, in no acute distress  HEENT: normal  Neck: no JVD, carotid bruits, or masses Cardiac: RRR; no murmurs, rubs, or gallops,no edema ; evidence of sinus arrhythmia with deep breathing Respiratory:  clear to auscultation bilaterally, normal work of breathing GI: soft, nontender, nondistended, + BS MS: no deformity or atrophy  Skin: warm and dry, no rash Neuro:  Strength and sensation are intact Psych: euthymic mood, full affect   EKG:   The ekg ordered today demonstrates normal sinus rhythm, no ST-T wave changes.   Recent Labs: 08/24/2020: Magnesium 5.9 08/28/2020: ALT 77; BUN 10; Creatinine, Ser 0.85; Hemoglobin 8.7; Platelets 210; Potassium 4.2; Sodium 134   Lipid Panel No results found for: CHOL, TRIG, HDL, CHOLHDL, VLDL, LDLCALC, LDLDIRECT   Other studies Reviewed:  Additional studies/ records that were reviewed today with results demonstrating: We reviewed her prior medications that she was taking while pregnant..   ASSESSMENT AND PLAN:  1. Elevated BP readings : We checked her home blood pressure cuff.  It read 146/100.  I then personally checked her blood pressure and got a reading of 142/86.  I suspect her home blood pressure cuff is pretty accurate in terms of reading her blood pressure.  Her readings at home are around 100/60.  Since her readings at home are well controlled, no further management is required.  She may notice higher blood pressure readings if her sleep patterns are significantly disturbed, but  this has not been an issue as of yet.  No indication for any medications at this time.  Cardiac exam is normal and she has no signs of heart failure. 2. Sinus arrhythmia: I explained to her be irregularity in her heartbeat with breathing (faster HR with inhalation), which is totally normal for her age and a sign of a healthy heart. 3. At some point in the future, she should have a screening lipid profile.  I would not do it now this close to pregnancy.   Current medicines are reviewed at length with the patient today.  The patient concerns regarding her medicines were addressed.  The following changes have been made:  No change  Labs/ tests ordered today include:  No orders of the defined types were placed in this encounter.   Recommend 150 minutes/week of aerobic exercise Low fat, low carb, high fiber diet recommended  Disposition:   FU as needed   Signed, Larae Grooms, MD  11/14/2020 1:27 PM    [facility] [place], [place], [place]  [address] Phone: [phone]; Fax: [phone]

## 2021-03-01 ENCOUNTER — Telehealth: Payer: Self-pay | Admitting: Genetic Counselor

## 2021-03-01 NOTE — Telephone Encounter (Signed)
Scheduled appt per 9/22 referral. Pt is aware of appt date and time.  

## 2021-03-11 ENCOUNTER — Other Ambulatory Visit: Payer: Self-pay

## 2021-03-11 ENCOUNTER — Encounter: Payer: Self-pay | Admitting: Genetic Counselor

## 2021-10-25 ENCOUNTER — Other Ambulatory Visit: Payer: Self-pay | Admitting: Nurse Practitioner

## 2021-10-25 DIAGNOSIS — R9389 Abnormal findings on diagnostic imaging of other specified body structures: Secondary | ICD-10-CM

## 2021-11-06 ENCOUNTER — Ambulatory Visit
Admission: RE | Admit: 2021-11-06 | Discharge: 2021-11-06 | Disposition: A | Discharge status: Home / Self Care | Payer: BC Managed Care – PPO | Source: Ambulatory Visit | Attending: Nurse Practitioner | Admitting: Nurse Practitioner

## 2021-11-06 DIAGNOSIS — R9389 Abnormal findings on diagnostic imaging of other specified body structures: Secondary | ICD-10-CM

## 2021-11-06 HISTORY — PX: BREAST BIOPSY: SHX20

## 2021-11-06 IMAGING — MG MM BREAST LOCALIZATION CLIP
4 series · 4 of 12 positions shown · non-contrast
Comparison: Previous exams including outside breast MRI dated
[DATE].

CLINICAL DATA: Status post MRI guided biopsy for linear non-mass
enhancement within the upper central posterior RIGHT breast

EXAM:
3D DIAGNOSTIC RIGHT MAMMOGRAM POST MRI BIOPSY

[R CC synth-2D]
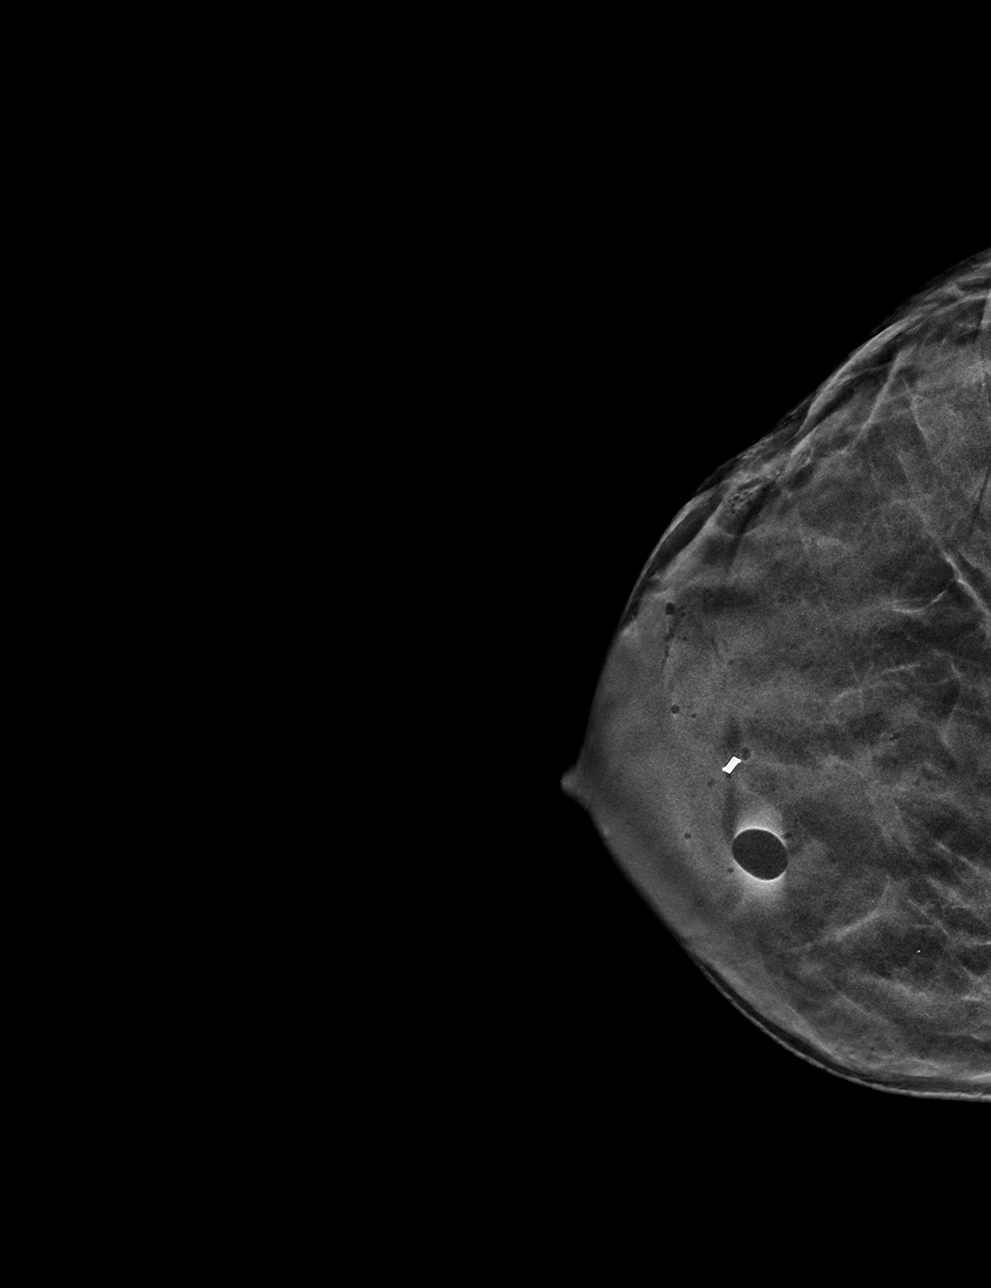

[R ML synth-2D]
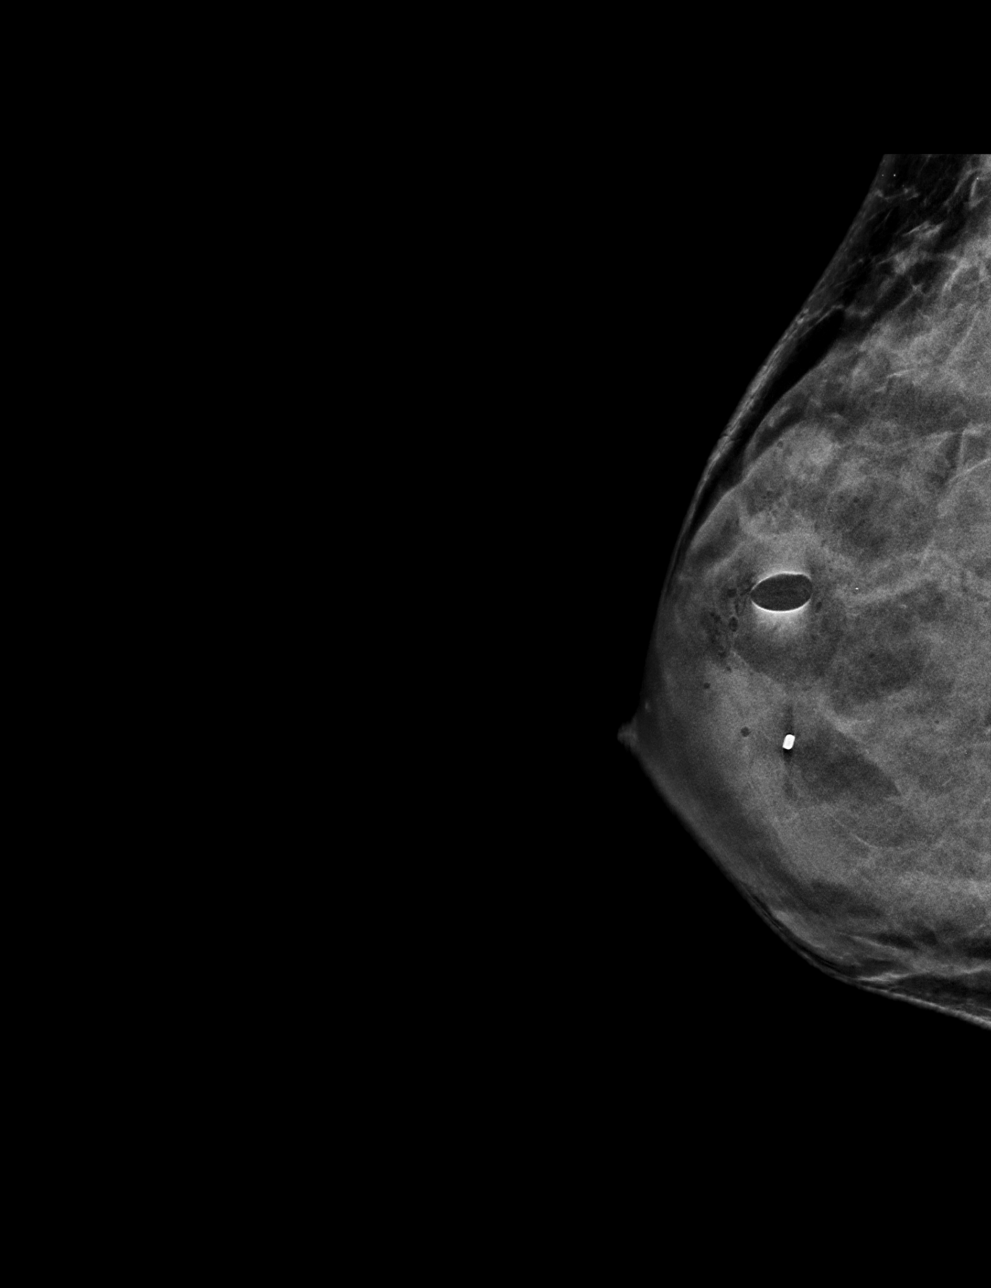

[R ML tomo · tomo slice 23/46.0]
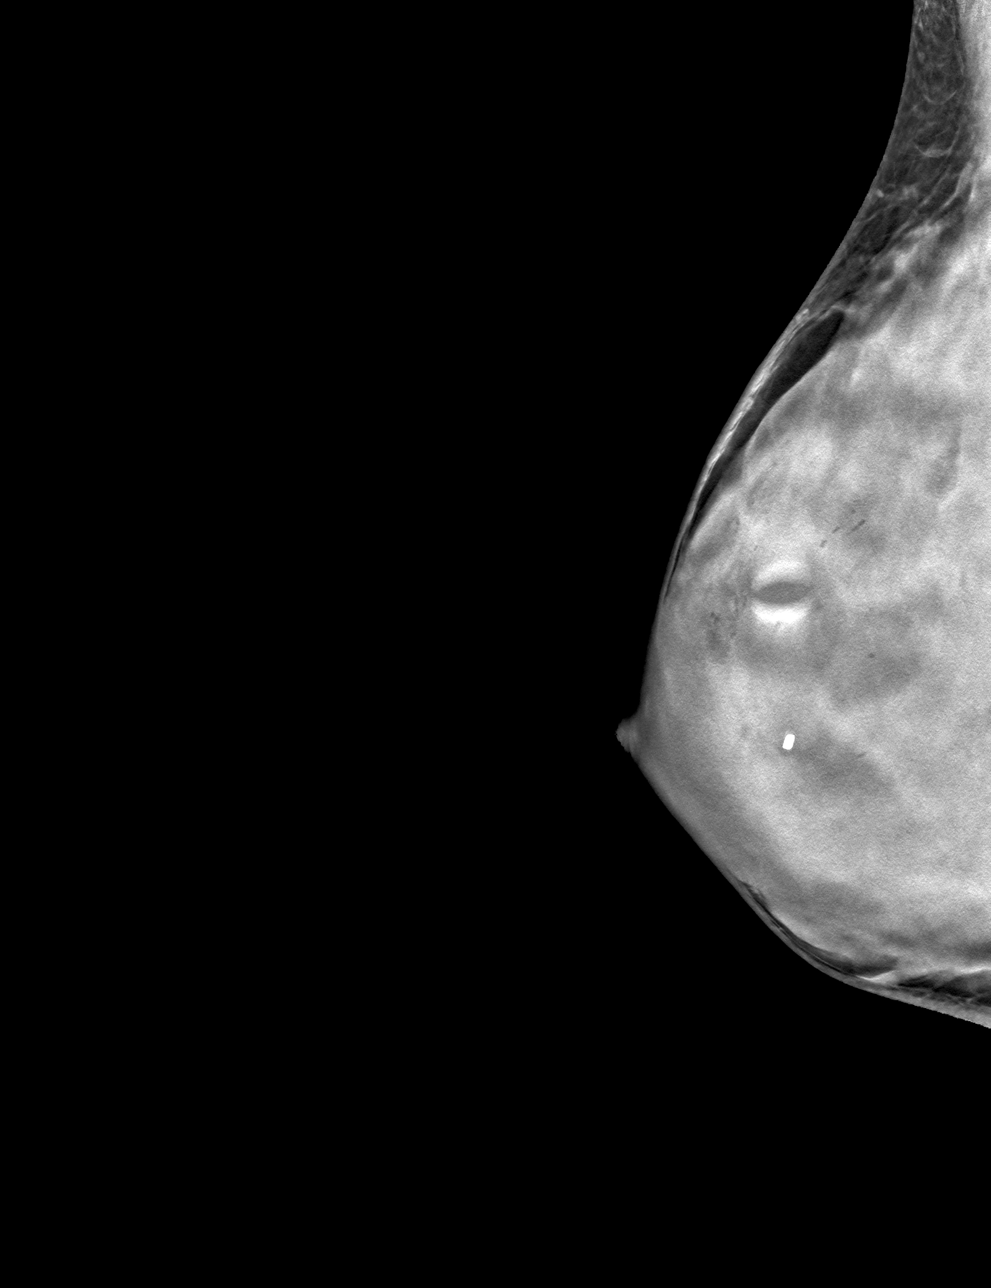

[R CC tomo · tomo slice 22/43.0]
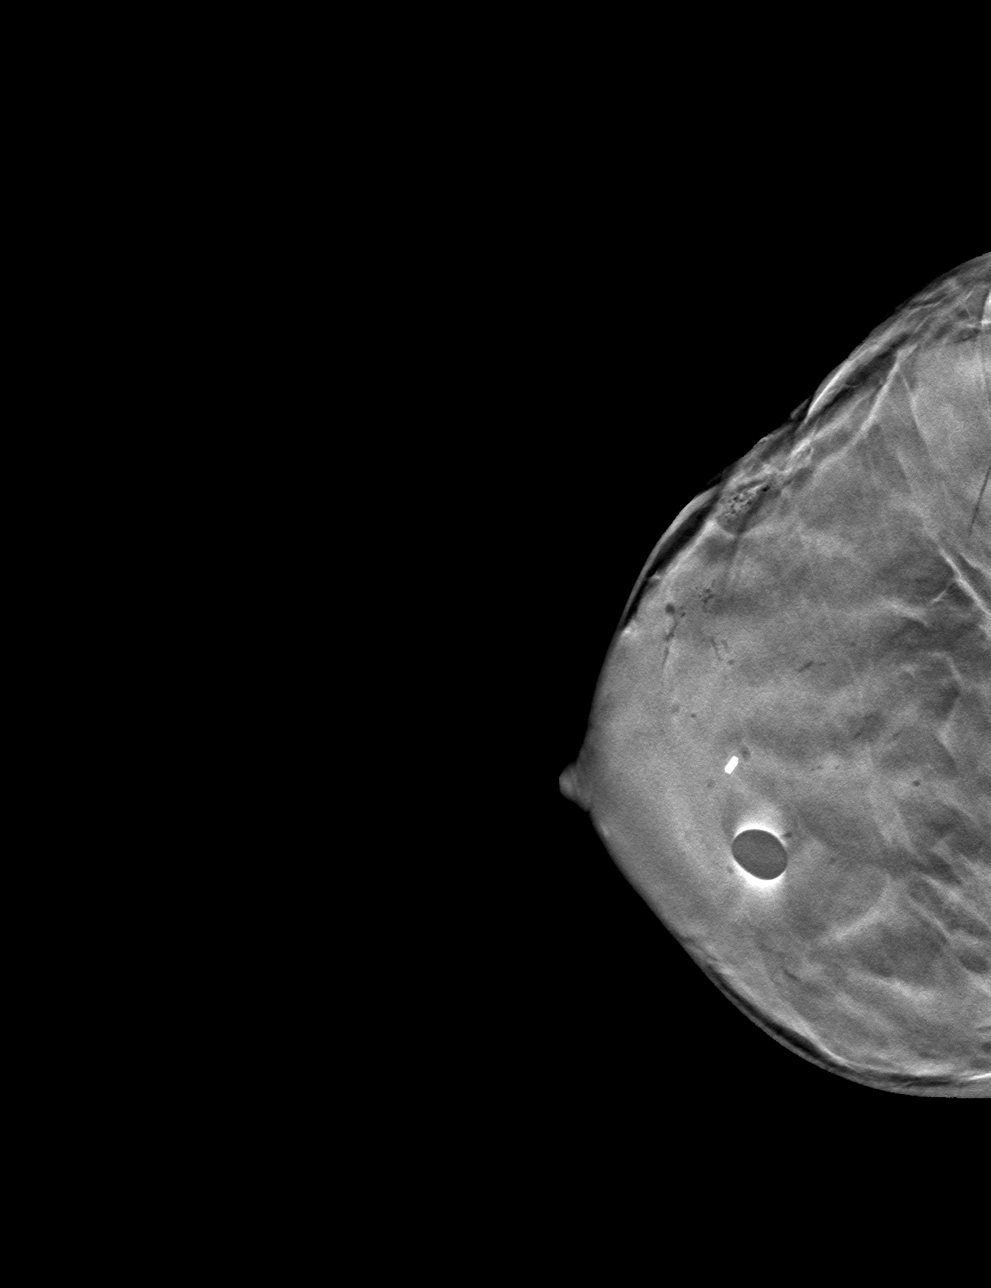

[4 of 12 positions shown; findings below may reference images not displayed]

FINDINGS: 3D Mammographic images were obtained following MRI guided biopsy of
the linear non-mass enhancement within the upper central posterior
RIGHT breast. The biopsy marking clip appears displaced both
anteriorly and inferiorly relative to the site of targeted non-mass
enhancement.
IMPRESSION: Cylinder shaped clip appears significantly displaced both anterior
and inferior relative to the expected location of the targeted
linear non-mass enhancement which is seen in the upper posterior
RIGHT breast.

Final Assessment: Post Procedure Mammograms for Marker Placement

## 2021-11-06 IMAGING — MR MR BREAST BX W/ LOC DEV 1ST LEASION IMAGE BX SPEC MR GUIDE*R*
8 of 12 series · 29 of 48 positions shown · IV contrast (6mL gadavist)
Comparison: None Available.
COMPARISON: None Available.
COMPARISON: None Available.

Addendum:
CLINICAL DATA: Patient presents today for MRI guided biopsy of
linear non-mass enhancement located within posterior RIGHT breast.

EXAM:
MRI GUIDED CORE NEEDLE BIOPSY OF THE RIGHT BREAST
TECHNIQUE: Multiplanar, multisequence MR imaging of the RIGHT breast was
performed both before and after administration of intravenous
contrast.
CONTRAST:  6mL GADAVIST GADOBUTROL 1 MMOL/ML IV SOLN

[Series 2: fiducial unilateral · sagittal · 2.0mm · 1.33mm/px · 3 of 52 slices shown]
[im 1/52]
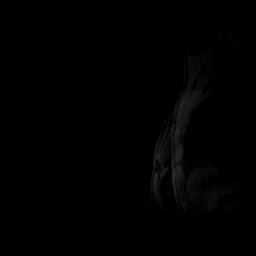
[im 26/52]
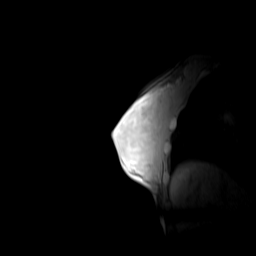
[im 52/52]
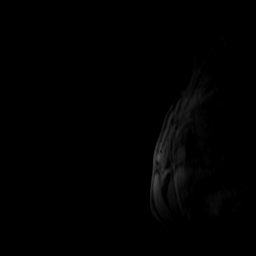

[Series 3: dynamic pre · axial · non-contrast · 1.3mm · 0.73mm/px · z∈[-98,+88]mm · 5 of 144 slices shown]
[im 1/144]
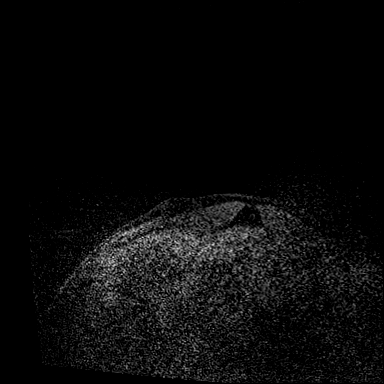
[im 36/144]
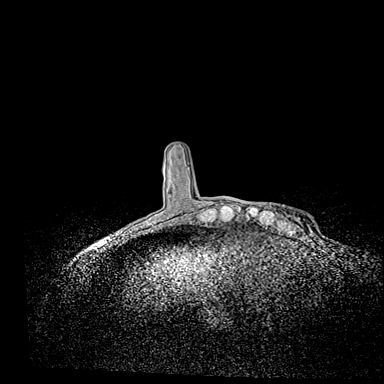
[im 72/144]
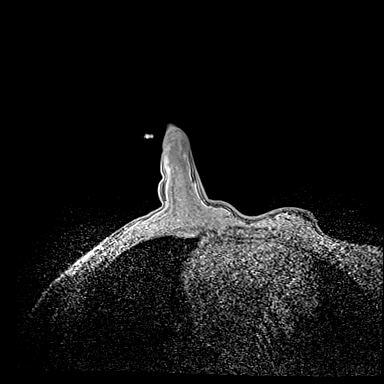
[im 108/144]
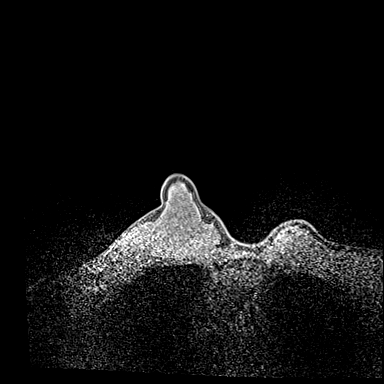
[im 144/144]
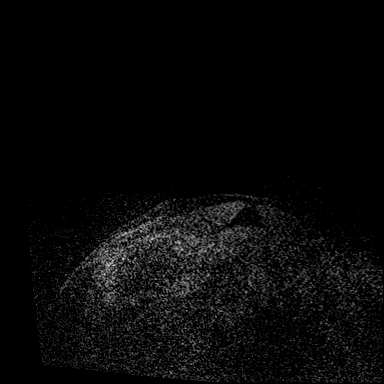

[Series 4: dynamic post 20 · axial · 1.3mm · 0.73mm/px · z∈[-98,+88]mm · 4 of 144 slices shown (1 of 2)]
[im 1/144]
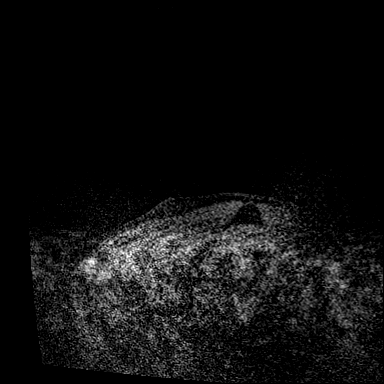
[im 48/144]
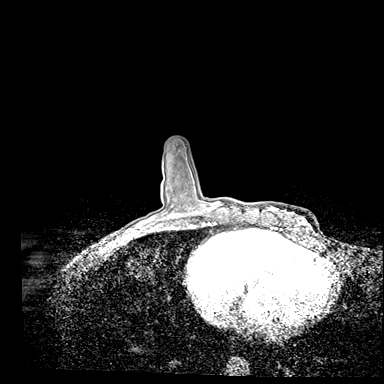
[im 96/144]
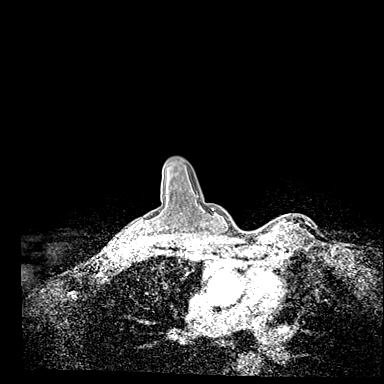
[im 144/144]
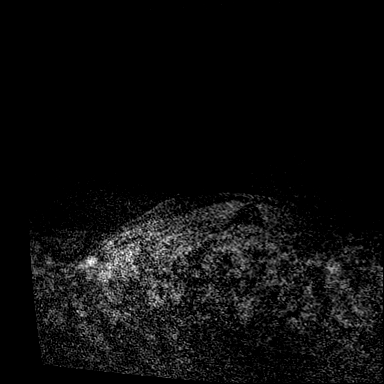

[Series 5: dynamic post 20 · axial · 1.3mm · 0.73mm/px · z∈[-98,+88]mm · 4 of 144 slices shown (2 of 2)]
[im 1/144]
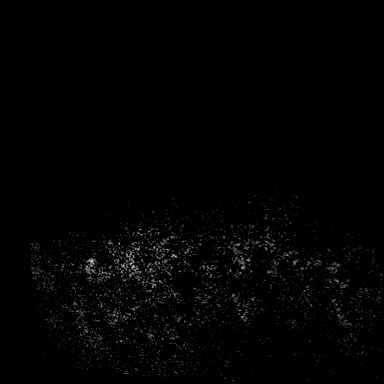
[im 48/144]
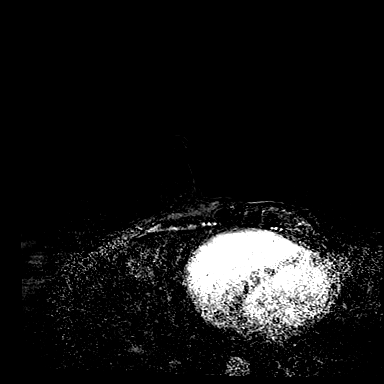
[im 96/144]
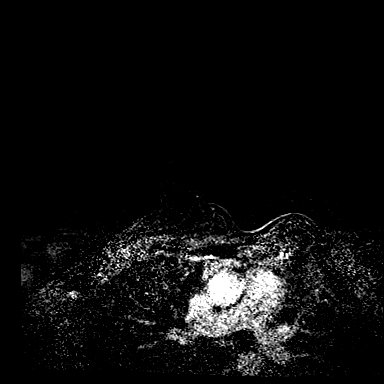
[im 144/144]
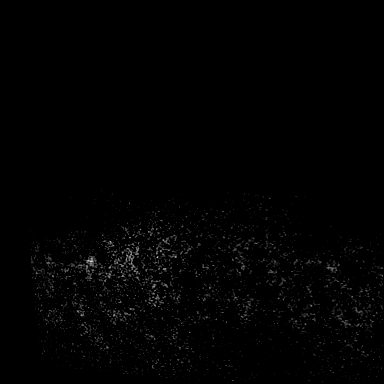

[Series 6: dynamic post 3 · axial · 1.3mm · 0.73mm/px · z∈[-98,+88]mm · 4 of 144 slices shown (1 of 2)]
[im 1/144]
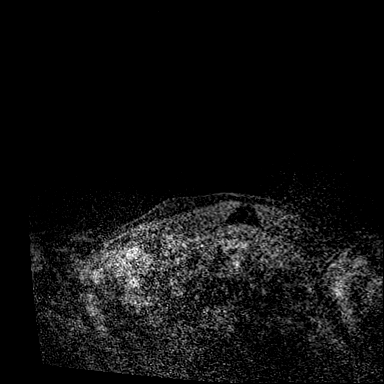
[im 48/144]
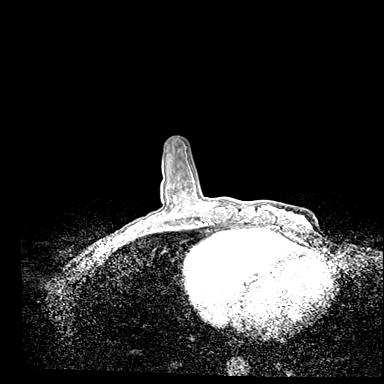
[im 96/144]
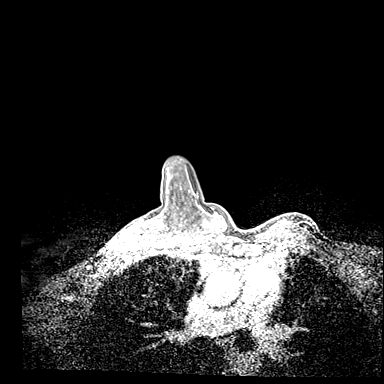
[im 144/144]
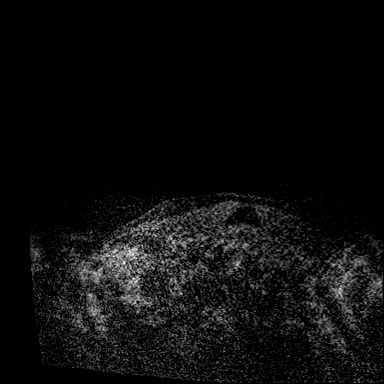

[Series 7: dynamic post 3 · axial · 1.3mm · 0.73mm/px · z∈[-98,+88]mm · 4 of 144 slices shown (2 of 2)]
[im 1/144]
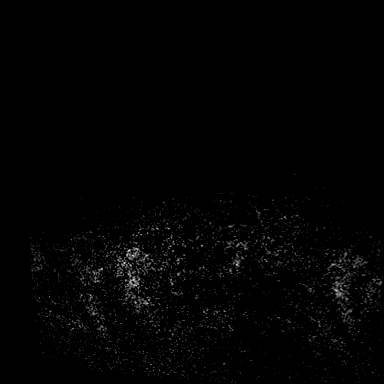
[im 48/144]
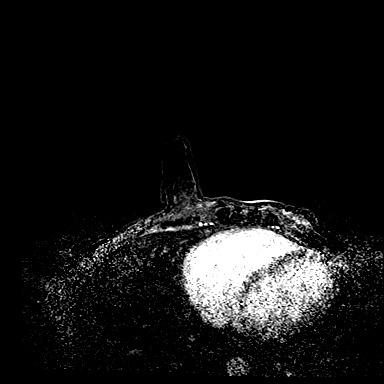
[im 96/144]
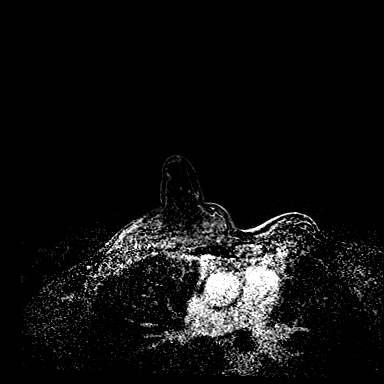
[im 144/144]
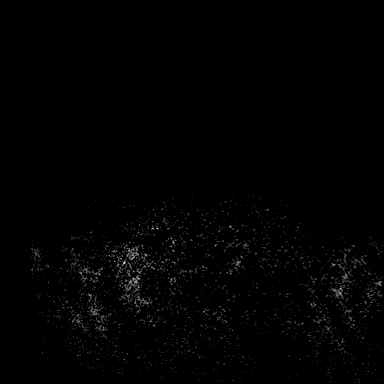

[Series 8: dynamic post 5 · axial · 1.3mm · 0.73mm/px · z∈[-98,+88]mm · 4 of 144 slices shown (1 of 2)]
[im 1/144]
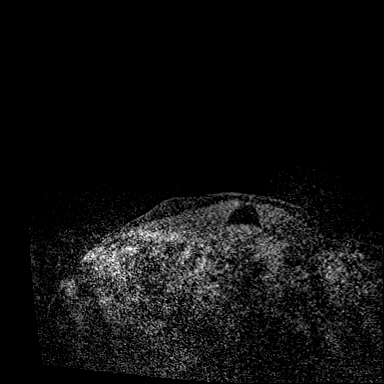
[im 48/144]
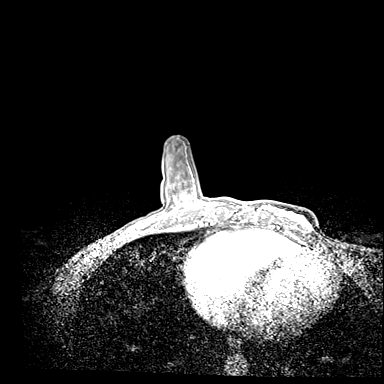
[im 96/144]
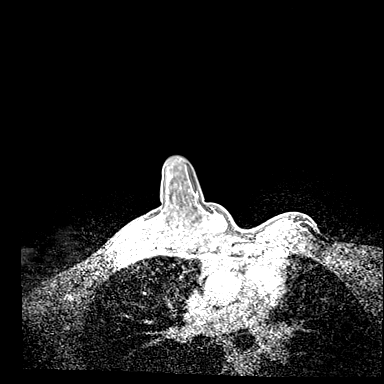
[im 144/144]
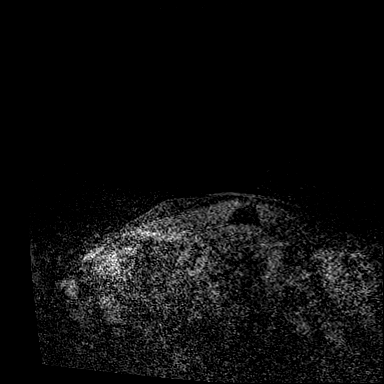

[Series 9: dynamic post 5 · axial · 1.3mm · 0.73mm/px · 1 of 144 slices shown (2 of 2)]
[im 1/144]
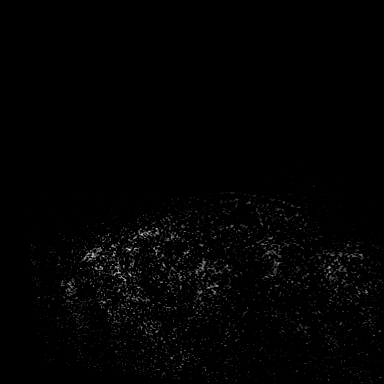

[29 of 48 positions shown; findings below may reference images not displayed]

FINDINGS: I met with the patient, and we discussed the procedure of MRI guided
biopsy, including risks, benefits, and alternatives. Specifically,
we discussed the risks of infection, bleeding, tissue injury, clip
migration, and inadequate sampling. Informed, written consent was
given. The usual time out protocol was performed immediately prior
to the procedure.

Using sterile technique, 1% Lidocaine, MRI guidance, and a 9 gauge
vacuum assisted device, biopsy was performed of the anterior aspect
of the linear non-mass enhancement within the upper central
posterior RIGHT breast using a lateral approach. At the conclusion
of the procedure, a cylinder shaped tissue marker clip was deployed
into the biopsy cavity. Follow-up 2-view mammogram was performed and
dictated separately.
IMPRESSION: MRI guided biopsy of linear non-mass enhancement within the upper
central posterior RIGHT breast. Due to the far posterior location of
the non-mass enhancement, only the anterior aspect was possibly
accessible and was targeted for today's biopsy. Post biopsy MRI
images show the non-mass enhancement to extend 1 cm posterior to the
biopsy cavity (extending to the anterior aspect of the underlying
pectoralis muscle). No apparent complications.

ADDENDUM:
Transcription/dictation error in report: COMPARISON section should
read: Outside breast MRI dated [DATE].

ADDENDUM:
Pathology revealed PSEUDOANGIOMATOUS STROMAL HYPERPLASIA (PASH) WITH
FOCI OF SCLEROSING ADENOSIS of the RIGHT breast, upper central
posterior, (cylinder clip). This was found to be concordant by Dr.
LONALDO. However, based on post procedure imaging, the targeted
non-mass enhancement within the far posterior RIGHT breast was not
adequately sampled. Therefore, patient has been scheduled for a
repeat MRI guided biopsy on [DATE].

Pathology results were discussed with the patient by telephone by
Dr. LONALDO. The patient reported doing well after the biopsy
with firmness, tenderness, and minimal bleeding at the site after
discharge from The [REDACTED]. Post biopsy instructions and care
were reviewed and questions were answered. The patient was
encouraged to call The [REDACTED] for any
additional concerns. My direct phone number was provided.

The patient is scheduled for a RIGHT breast MRI guided biopsy on
[DATE]. Further recommendations will be guided by the results
of this results.

Pathology results reported by LONALDO, RN on [DATE].

*** End of Addendum ***
Addendum:
FINDINGS: I met with the patient, and we discussed the procedure of MRI guided
biopsy, including risks, benefits, and alternatives. Specifically,
we discussed the risks of infection, bleeding, tissue injury, clip
migration, and inadequate sampling. Informed, written consent was
given. The usual time out protocol was performed immediately prior
to the procedure.

Using sterile technique, 1% Lidocaine, MRI guidance, and a 9 gauge
vacuum assisted device, biopsy was performed of the anterior aspect
of the linear non-mass enhancement within the upper central
posterior RIGHT breast using a lateral approach. At the conclusion
of the procedure, a cylinder shaped tissue marker clip was deployed
into the biopsy cavity. Follow-up 2-view mammogram was performed and
dictated separately.
IMPRESSION: MRI guided biopsy of linear non-mass enhancement within the upper
central posterior RIGHT breast. Due to the far posterior location of
the non-mass enhancement, only the anterior aspect was possibly
accessible and was targeted for today's biopsy. Post biopsy MRI
images show the non-mass enhancement to extend 1 cm posterior to the
biopsy cavity (extending to the anterior aspect of the underlying
pectoralis muscle). No apparent complications.

ADDENDUM:
Transcription/dictation error in report: COMPARISON section should
read: Outside breast MRI dated [DATE].

*** End of Addendum ***
FINDINGS: I met with the patient, and we discussed the procedure of MRI guided
biopsy, including risks, benefits, and alternatives. Specifically,
we discussed the risks of infection, bleeding, tissue injury, clip
migration, and inadequate sampling. Informed, written consent was
given. The usual time out protocol was performed immediately prior
to the procedure.

Using sterile technique, 1% Lidocaine, MRI guidance, and a 9 gauge
vacuum assisted device, biopsy was performed of the anterior aspect
of the linear non-mass enhancement within the upper central
posterior RIGHT breast using a lateral approach. At the conclusion
of the procedure, a cylinder shaped tissue marker clip was deployed
into the biopsy cavity. Follow-up 2-view mammogram was performed and
dictated separately.
IMPRESSION: MRI guided biopsy of linear non-mass enhancement within the upper
central posterior RIGHT breast. Due to the far posterior location of
the non-mass enhancement, only the anterior aspect was possibly
accessible and was targeted for today's biopsy. Post biopsy MRI
images show the non-mass enhancement to extend 1 cm posterior to the
biopsy cavity (extending to the anterior aspect of the underlying
pectoralis muscle). No apparent complications.

## 2021-11-06 MED ORDER — GADOBUTROL 1 MMOL/ML IV SOLN
6.0000 mL | Freq: Once | INTRAVENOUS | Status: AC | PRN
  Administered 2021-11-06: 6 mL via INTRAVENOUS

## 2021-11-07 ENCOUNTER — Other Ambulatory Visit: Payer: Self-pay | Admitting: Nurse Practitioner

## 2021-11-07 DIAGNOSIS — R9389 Abnormal findings on diagnostic imaging of other specified body structures: Secondary | ICD-10-CM

## 2021-12-01 ENCOUNTER — Other Ambulatory Visit: Payer: Self-pay | Admitting: Nurse Practitioner

## 2021-12-01 DIAGNOSIS — R928 Other abnormal and inconclusive findings on diagnostic imaging of breast: Secondary | ICD-10-CM

## 2021-12-02 ENCOUNTER — Ambulatory Visit: Admission: RE | Admit: 2021-12-02 | Payer: Self-pay | Source: Ambulatory Visit

## 2021-12-02 ENCOUNTER — Ambulatory Visit
Admission: RE | Admit: 2021-12-02 | Discharge: 2021-12-02 | Disposition: A | Discharge status: Home / Self Care | Payer: BC Managed Care – PPO | Source: Ambulatory Visit | Attending: Nurse Practitioner | Admitting: Nurse Practitioner

## 2021-12-02 ENCOUNTER — Other Ambulatory Visit: Payer: Self-pay | Admitting: Nurse Practitioner

## 2021-12-02 DIAGNOSIS — R9389 Abnormal findings on diagnostic imaging of other specified body structures: Secondary | ICD-10-CM

## 2021-12-02 DIAGNOSIS — R928 Other abnormal and inconclusive findings on diagnostic imaging of breast: Secondary | ICD-10-CM

## 2021-12-02 IMAGING — MR MR BREAST BX W/ LOC DEV 1ST LEASION IMAGE BX SPEC MR GUIDE*R*
6 of 8 series · 28 of 48 positions shown · IV contrast (5 ml multihance)
Comparison: None Available.
COMPARISON: None Available.
COMPARISON: None Available.

Addendum:
CLINICAL DATA: Patient presents today for MRI guided biopsy of
linear non-mass enhancement located within posterior RIGHT breast.

EXAM:
MRI GUIDED CORE NEEDLE BIOPSY OF THE RIGHT BREAST
TECHNIQUE: Multiplanar, multisequence MR imaging of the RIGHT breast was
performed both before and after administration of intravenous
contrast.
CONTRAST:  6mL GADAVIST GADOBUTROL 1 MMOL/ML IV SOLN

[Series 2: fiducial unilateral · sagittal · 2.0mm · 1.33mm/px · 1 of 52 slices shown]
[im 1/52]
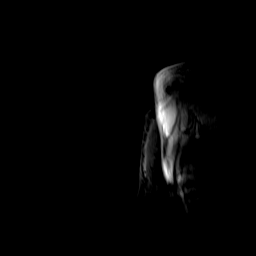

[Series 3: dynamic pre · axial · non-contrast · 1.3mm · 0.73mm/px · z∈[-119,+67]mm · 6 of 144 slices shown]
[im 1/144]
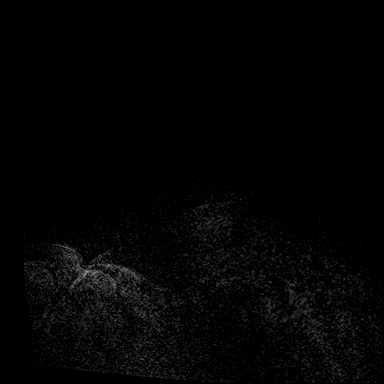
[im 29/144]
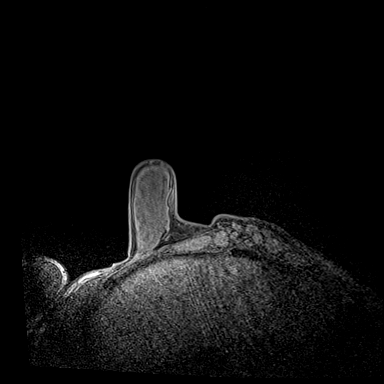
[im 58/144]
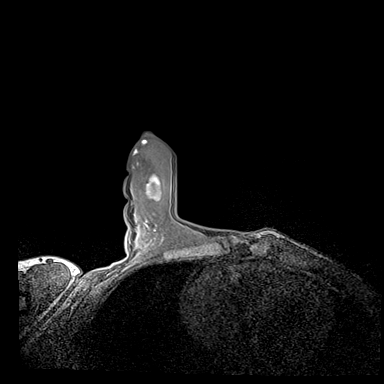
[im 86/144]
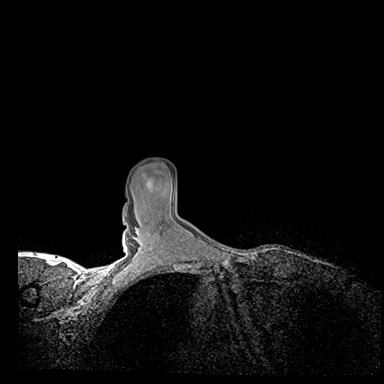
[im 115/144]
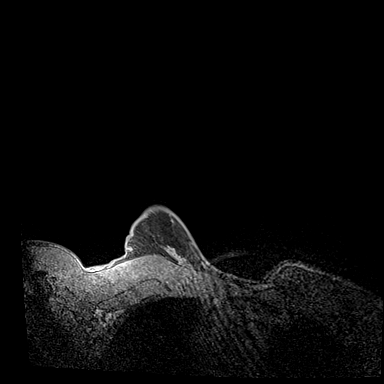
[im 144/144]
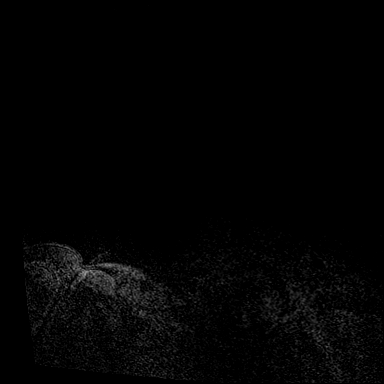

[Series 4: dynamic post 20 · axial · 1.3mm · 0.73mm/px · z∈[-119,+67]mm · 6 of 144 slices shown (1 of 2)]
[im 1/144]
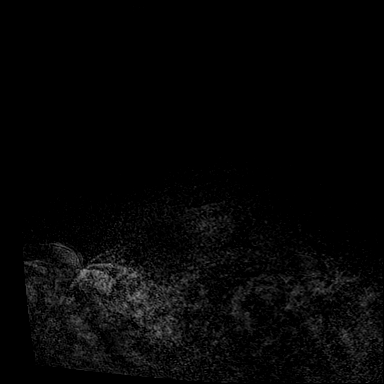
[im 29/144]
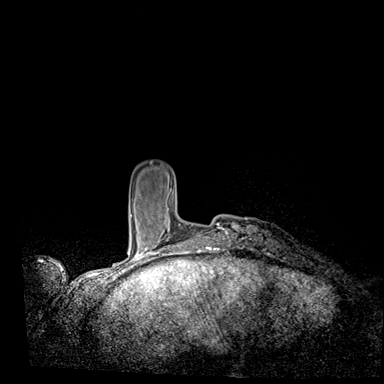
[im 58/144]
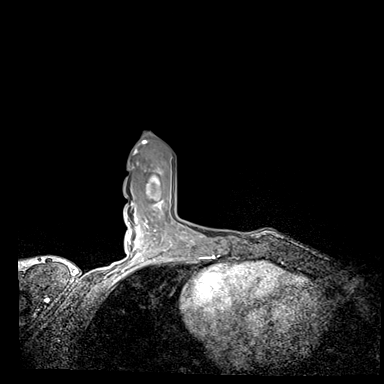
[im 86/144]
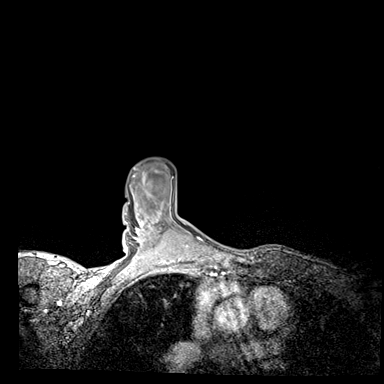
[im 115/144]
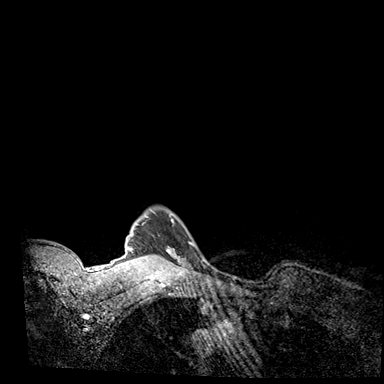
[im 144/144]
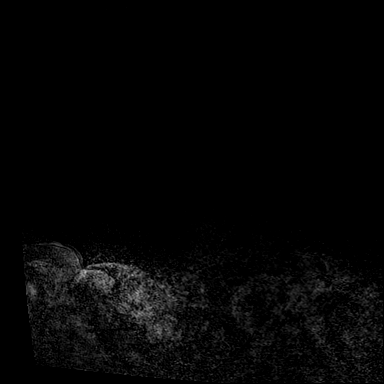

[Series 5: dynamic post 20 · axial · 1.3mm · 0.73mm/px · z∈[-119,+67]mm · 7 of 144 slices shown (2 of 2)]
[im 1/144]
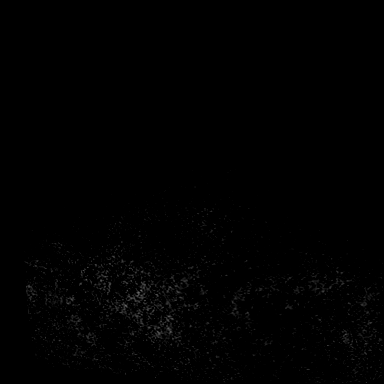
[im 24/144]
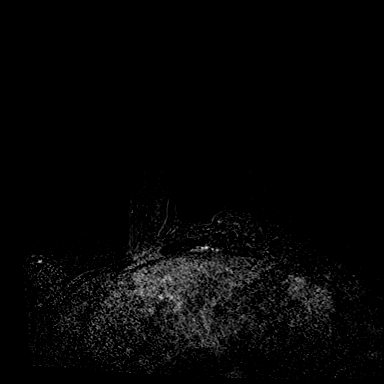
[im 48/144]
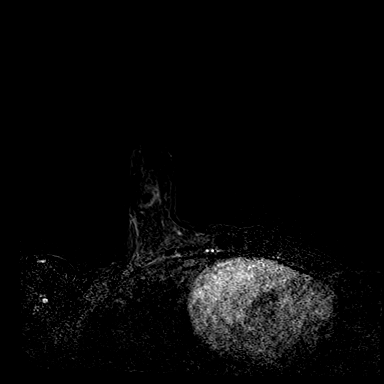
[im 72/144]
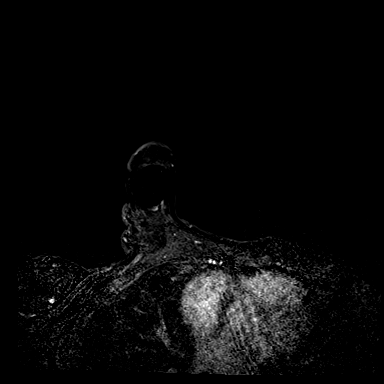
[im 96/144]
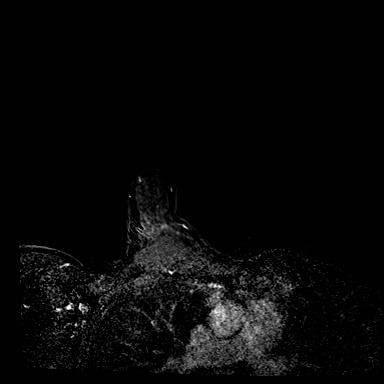
[im 120/144]
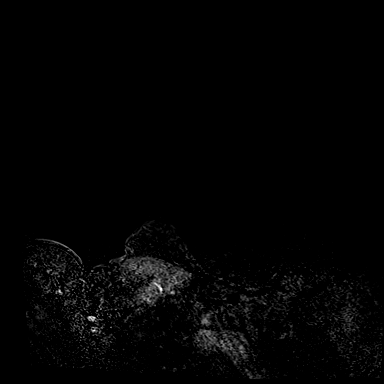
[im 144/144]
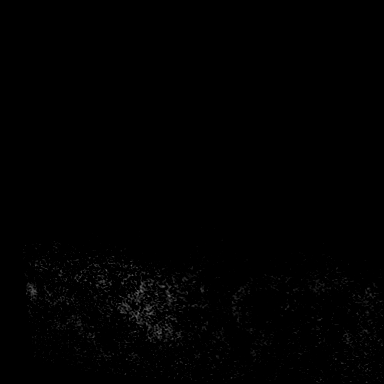

[Series 6: dynamic post 3 · axial · 1.3mm · 0.73mm/px · z∈[-119,+67]mm · 7 of 144 slices shown (1 of 2)]
[im 1/144]
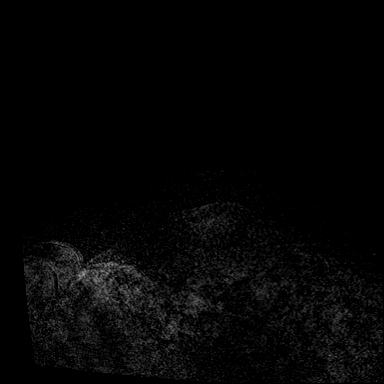
[im 24/144]
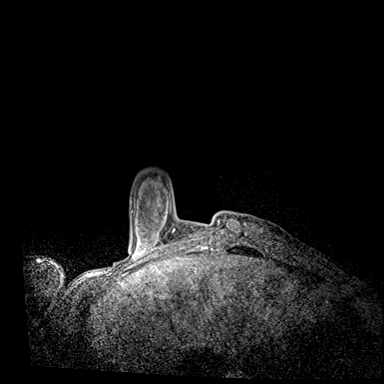
[im 48/144]
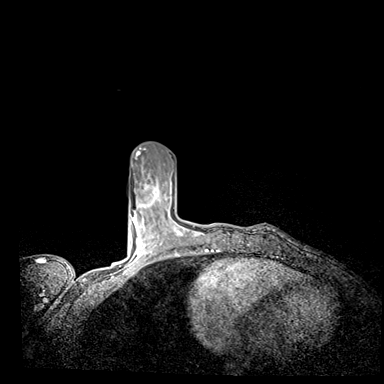
[im 72/144]
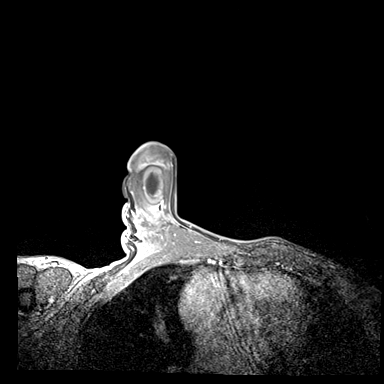
[im 96/144]
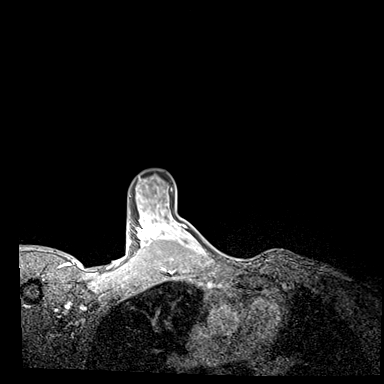
[im 120/144]
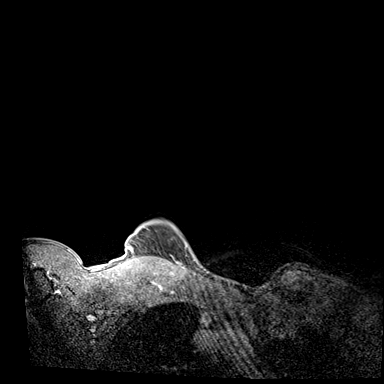
[im 144/144]
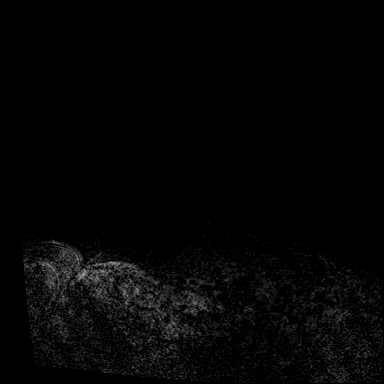

[Series 7: dynamic post 3 · axial · 1.3mm · 0.73mm/px · 1 of 144 slices shown (2 of 2)]
[im 1/144]
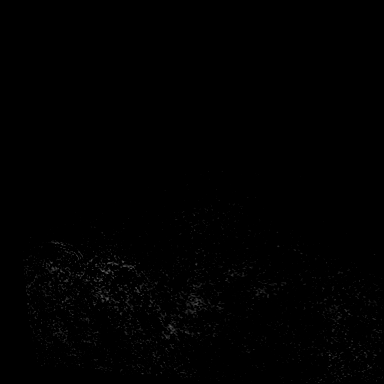

[28 of 48 positions shown; findings below may reference images not displayed]

FINDINGS: I met with the patient, and we discussed the procedure of MRI guided
biopsy, including risks, benefits, and alternatives. Specifically,
we discussed the risks of infection, bleeding, tissue injury, clip
migration, and inadequate sampling. Informed, written consent was
given. The usual time out protocol was performed immediately prior
to the procedure.

Using sterile technique, 1% Lidocaine, MRI guidance, and a 9 gauge
vacuum assisted device, biopsy was performed of the anterior aspect
of the linear non-mass enhancement within the upper central
posterior RIGHT breast using a lateral approach. At the conclusion
of the procedure, a cylinder shaped tissue marker clip was deployed
into the biopsy cavity. Follow-up 2-view mammogram was performed and
dictated separately.
IMPRESSION: MRI guided biopsy of linear non-mass enhancement within the upper
central posterior RIGHT breast. Due to the far posterior location of
the non-mass enhancement, only the anterior aspect was possibly
accessible and was targeted for today's biopsy. Post biopsy MRI
images show the non-mass enhancement to extend 1 cm posterior to the
biopsy cavity (extending to the anterior aspect of the underlying
pectoralis muscle). No apparent complications.

ADDENDUM:
Transcription/dictation error in report: COMPARISON section should
read: Outside breast MRI dated [DATE].

ADDENDUM:
Pathology revealed PSEUDOANGIOMATOUS STROMAL HYPERPLASIA (PASH) WITH
FOCI OF SCLEROSING ADENOSIS of the RIGHT breast, upper central
posterior, (cylinder clip). This was found to be concordant by Dr.
LONALDO. However, based on post procedure imaging, the targeted
non-mass enhancement within the far posterior RIGHT breast was not
adequately sampled. Therefore, patient has been scheduled for a
repeat MRI guided biopsy on [DATE].

Pathology results were discussed with the patient by telephone by
Dr. LONALDO. The patient reported doing well after the biopsy
with firmness, tenderness, and minimal bleeding at the site after
discharge from The [REDACTED]. Post biopsy instructions and care
were reviewed and questions were answered. The patient was
encouraged to call The [REDACTED] for any
additional concerns. My direct phone number was provided.

The patient is scheduled for a RIGHT breast MRI guided biopsy on
[DATE]. Further recommendations will be guided by the results
of this results.

Pathology results reported by LONALDO, RN on [DATE].

*** End of Addendum ***
Addendum:
FINDINGS: I met with the patient, and we discussed the procedure of MRI guided
biopsy, including risks, benefits, and alternatives. Specifically,
we discussed the risks of infection, bleeding, tissue injury, clip
migration, and inadequate sampling. Informed, written consent was
given. The usual time out protocol was performed immediately prior
to the procedure.

Using sterile technique, 1% Lidocaine, MRI guidance, and a 9 gauge
vacuum assisted device, biopsy was performed of the anterior aspect
of the linear non-mass enhancement within the upper central
posterior RIGHT breast using a lateral approach. At the conclusion
of the procedure, a cylinder shaped tissue marker clip was deployed
into the biopsy cavity. Follow-up 2-view mammogram was performed and
dictated separately.
IMPRESSION: MRI guided biopsy of linear non-mass enhancement within the upper
central posterior RIGHT breast. Due to the far posterior location of
the non-mass enhancement, only the anterior aspect was possibly
accessible and was targeted for today's biopsy. Post biopsy MRI
images show the non-mass enhancement to extend 1 cm posterior to the
biopsy cavity (extending to the anterior aspect of the underlying
pectoralis muscle). No apparent complications.

ADDENDUM:
Transcription/dictation error in report: COMPARISON section should
read: Outside breast MRI dated [DATE].

*** End of Addendum ***
FINDINGS: I met with the patient, and we discussed the procedure of MRI guided
biopsy, including risks, benefits, and alternatives. Specifically,
we discussed the risks of infection, bleeding, tissue injury, clip
migration, and inadequate sampling. Informed, written consent was
given. The usual time out protocol was performed immediately prior
to the procedure.

Using sterile technique, 1% Lidocaine, MRI guidance, and a 9 gauge
vacuum assisted device, biopsy was performed of the anterior aspect
of the linear non-mass enhancement within the upper central
posterior RIGHT breast using a lateral approach. At the conclusion
of the procedure, a cylinder shaped tissue marker clip was deployed
into the biopsy cavity. Follow-up 2-view mammogram was performed and
dictated separately.
IMPRESSION: MRI guided biopsy of linear non-mass enhancement within the upper
central posterior RIGHT breast. Due to the far posterior location of
the non-mass enhancement, only the anterior aspect was possibly
accessible and was targeted for today's biopsy. Post biopsy MRI
images show the non-mass enhancement to extend 1 cm posterior to the
biopsy cavity (extending to the anterior aspect of the underlying
pectoralis muscle). No apparent complications.

## 2022-01-27 ENCOUNTER — Ambulatory Visit
Admission: RE | Admit: 2022-01-27 | Discharge: 2022-01-27 | Disposition: A | Discharge status: Home / Self Care | Payer: BC Managed Care – PPO | Source: Ambulatory Visit | Attending: Nurse Practitioner | Admitting: Nurse Practitioner

## 2022-01-27 DIAGNOSIS — R9389 Abnormal findings on diagnostic imaging of other specified body structures: Secondary | ICD-10-CM

## 2022-01-27 DIAGNOSIS — R928 Other abnormal and inconclusive findings on diagnostic imaging of breast: Secondary | ICD-10-CM

## 2022-01-27 HISTORY — PX: BREAST BIOPSY: SHX20

## 2022-01-27 MED ORDER — GADOBUTROL 1 MMOL/ML IV SOLN
5.0000 mL | Freq: Once | INTRAVENOUS | Status: AC | PRN
  Administered 2022-01-27: 5 mL via INTRAVENOUS

## 2022-03-12 ENCOUNTER — Other Ambulatory Visit: Payer: Self-pay | Admitting: Nurse Practitioner

## 2022-03-12 DIAGNOSIS — N6019 Diffuse cystic mastopathy of unspecified breast: Secondary | ICD-10-CM

## 2022-04-23 ENCOUNTER — Ambulatory Visit
Admission: RE | Admit: 2022-04-23 | Discharge: 2022-04-23 | Disposition: A | Discharge status: Home / Self Care | Payer: BC Managed Care – PPO | Source: Ambulatory Visit | Attending: Nurse Practitioner | Admitting: Nurse Practitioner

## 2022-04-23 DIAGNOSIS — N6019 Diffuse cystic mastopathy of unspecified breast: Secondary | ICD-10-CM

## 2022-04-23 MED ORDER — GADOPICLENOL 0.5 MMOL/ML IV SOLN
6.0000 mL | Freq: Once | INTRAVENOUS | Status: AC | PRN
  Administered 2022-04-23: 6 mL via INTRAVENOUS

## 2022-05-14 ENCOUNTER — Other Ambulatory Visit: Payer: Self-pay | Admitting: General Surgery

## 2022-05-14 DIAGNOSIS — R928 Other abnormal and inconclusive findings on diagnostic imaging of breast: Secondary | ICD-10-CM

## 2022-05-16 ENCOUNTER — Other Ambulatory Visit: Payer: Self-pay | Admitting: General Surgery

## 2022-05-16 DIAGNOSIS — R928 Other abnormal and inconclusive findings on diagnostic imaging of breast: Secondary | ICD-10-CM

## 2022-05-30 ENCOUNTER — Encounter (HOSPITAL_BASED_OUTPATIENT_CLINIC_OR_DEPARTMENT_OTHER): Payer: Self-pay | Admitting: General Surgery

## 2022-05-30 ENCOUNTER — Other Ambulatory Visit: Payer: Self-pay

## 2022-06-11 ENCOUNTER — Ambulatory Visit
Admission: RE | Admit: 2022-06-11 | Discharge: 2022-06-11 | Disposition: A | Discharge status: Home / Self Care | Payer: Self-pay | Source: Ambulatory Visit | Attending: General Surgery | Admitting: General Surgery

## 2022-06-11 DIAGNOSIS — R928 Other abnormal and inconclusive findings on diagnostic imaging of breast: Secondary | ICD-10-CM

## 2022-06-11 HISTORY — PX: BREAST BIOPSY: SHX20

## 2022-06-11 MED ORDER — ENSURE PRE-SURGERY PO LIQD: 296.0000 mL | Freq: Once | ORAL | Status: DC

## 2022-06-11 NOTE — Progress Notes (Signed)
Sent text message reminding pt to come in for ensure drink and CHG soap.

## 2022-06-11 NOTE — Progress Notes (Signed)
Surgical soap given to patient with instructions and patient verbalized understanding.           Patient Instructions  The night before surgery:  No food after midnight. ONLY clear liquids after midnight  The day of surgery (if you do NOT have diabetes):  Drink ONE (1) Pre-Surgery Clear Ensure as directed.   This drink was given to you during your hospital  pre-op appointment visit. The pre-op nurse will instruct you on the time to drink the  Pre-Surgery Ensure depending on your surgery time. Finish the drink at the designated time by the pre-op nurse.  Nothing else to drink after completing the  Pre-Surgery Clear Ensure.  The day of surgery (if you have diabetes): Drink ONE (1) Gatorade 2 (G2) as directed. This drink was given to you during your hospital  pre-op appointment visit.  The pre-op nurse will instruct you on the time to drink the   Gatorade 2 (G2) depending on your surgery time. Color of the Gatorade may vary. Red is not allowed. Nothing else to drink after completing the  Gatorade 2 (G2).         If you have questions, please contact your surgeon's office.      Patient Instructions  The night before surgery:  No food after midnight. ONLY clear liquids after midnight  The day of surgery (if you do NOT have diabetes):  Drink ONE (1) Pre-Surgery Clear Ensure as directed.   This drink was given to you during your hospital  pre-op appointment visit. The pre-op nurse will instruct you on the time to drink the  Pre-Surgery Ensure depending on your surgery time. Finish the drink at the designated time by the pre-op nurse.  Nothing else to drink after completing the  Pre-Surgery Clear Ensure.  The day of surgery (if you have diabetes): Drink ONE (1) Gatorade 2 (G2) as directed. This drink was given to you during your hospital  pre-op appointment visit.  The pre-op nurse will instruct you on the time to drink the   Gatorade 2 (G2) depending on your  surgery time. Color of the Gatorade may vary. Red is not allowed. Nothing else to drink after completing the  Gatorade 2 (G2).         If you have questions, please contact your surgeon's office.

## 2022-06-12 ENCOUNTER — Ambulatory Visit (HOSPITAL_BASED_OUTPATIENT_CLINIC_OR_DEPARTMENT_OTHER): Payer: Commercial Managed Care - HMO | Admitting: Certified Registered Nurse Anesthetist

## 2022-06-12 ENCOUNTER — Ambulatory Visit
Admission: RE | Admit: 2022-06-12 | Discharge: 2022-06-12 | Disposition: A | Discharge status: Home / Self Care | Payer: Commercial Managed Care - HMO | Source: Ambulatory Visit | Attending: General Surgery | Admitting: General Surgery

## 2022-06-12 ENCOUNTER — Other Ambulatory Visit: Payer: Self-pay

## 2022-06-12 ENCOUNTER — Ambulatory Visit (HOSPITAL_BASED_OUTPATIENT_CLINIC_OR_DEPARTMENT_OTHER)
Admission: RE | Admit: 2022-06-12 | Discharge: 2022-06-12 | Disposition: A | Discharge status: Home / Self Care | Payer: Commercial Managed Care - HMO | Source: Ambulatory Visit | Attending: General Surgery | Admitting: General Surgery

## 2022-06-12 ENCOUNTER — Encounter (HOSPITAL_BASED_OUTPATIENT_CLINIC_OR_DEPARTMENT_OTHER)
Admission: RE | Disposition: A | Discharge status: Home / Self Care | Payer: Self-pay | Source: Ambulatory Visit | Attending: General Surgery

## 2022-06-12 ENCOUNTER — Encounter (HOSPITAL_BASED_OUTPATIENT_CLINIC_OR_DEPARTMENT_OTHER): Payer: Self-pay | Admitting: General Surgery

## 2022-06-12 DIAGNOSIS — Z8041 Family history of malignant neoplasm of ovary: Secondary | ICD-10-CM | POA: Insufficient documentation

## 2022-06-12 DIAGNOSIS — Z803 Family history of malignant neoplasm of breast: Secondary | ICD-10-CM | POA: Diagnosis not present

## 2022-06-12 DIAGNOSIS — N6489 Other specified disorders of breast: Secondary | ICD-10-CM | POA: Diagnosis not present

## 2022-06-12 DIAGNOSIS — R928 Other abnormal and inconclusive findings on diagnostic imaging of breast: Secondary | ICD-10-CM | POA: Diagnosis present

## 2022-06-12 DIAGNOSIS — Z1501 Genetic susceptibility to malignant neoplasm of breast: Secondary | ICD-10-CM | POA: Diagnosis not present

## 2022-06-12 HISTORY — PX: RADIOACTIVE SEED GUIDED EXCISIONAL BREAST BIOPSY: SHX6490

## 2022-06-12 LAB — POCT PREGNANCY, URINE: Preg Test, Ur: NEGATIVE

## 2022-06-12 SURGERY — RADIOACTIVE SEED GUIDED BREAST BIOPSY
Anesthesia: General | Site: Breast | Laterality: Right

## 2022-06-12 MED ORDER — KETOROLAC TROMETHAMINE 15 MG/ML IJ SOLN
INTRAMUSCULAR | Status: AC
  Filled 2022-06-12: qty 1

## 2022-06-12 MED ORDER — CEFAZOLIN SODIUM-DEXTROSE 2-4 GM/100ML-% IV SOLN
INTRAVENOUS | Status: AC
  Filled 2022-06-12: qty 100

## 2022-06-12 MED ORDER — MEPERIDINE HCL 25 MG/ML IJ SOLN: 6.2500 mg | INTRAMUSCULAR | Status: DC | PRN

## 2022-06-12 MED ORDER — MIDAZOLAM HCL 2 MG/2ML IJ SOLN
INTRAMUSCULAR | Status: AC
  Filled 2022-06-12: qty 2

## 2022-06-12 MED ORDER — KETOROLAC TROMETHAMINE 30 MG/ML IJ SOLN
INTRAMUSCULAR | Status: AC
  Filled 2022-06-12: qty 1

## 2022-06-12 MED ORDER — FENTANYL CITRATE (PF) 100 MCG/2ML IJ SOLN
INTRAMUSCULAR | Status: DC | PRN
  Administered 2022-06-12 (×2): 50 ug via INTRAVENOUS

## 2022-06-12 MED ORDER — PROPOFOL 10 MG/ML IV BOLUS
INTRAVENOUS | Status: AC
  Filled 2022-06-12: qty 20

## 2022-06-12 MED ORDER — CHLORHEXIDINE GLUCONATE CLOTH 2 % EX PADS: 6.0000 | MEDICATED_PAD | Freq: Once | CUTANEOUS | Status: DC

## 2022-06-12 MED ORDER — FENTANYL CITRATE (PF) 100 MCG/2ML IJ SOLN
INTRAMUSCULAR | Status: AC
  Filled 2022-06-12: qty 2

## 2022-06-12 MED ORDER — EPHEDRINE SULFATE (PRESSORS) 50 MG/ML IJ SOLN
INTRAMUSCULAR | Status: DC | PRN
  Administered 2022-06-12: 10 mg via INTRAVENOUS

## 2022-06-12 MED ORDER — BUPIVACAINE HCL (PF) 0.25 % IJ SOLN
INTRAMUSCULAR | Status: AC
  Filled 2022-06-12: qty 180

## 2022-06-12 MED ORDER — LACTATED RINGERS IV SOLN: INTRAVENOUS | Status: DC | PRN

## 2022-06-12 MED ORDER — DEXAMETHASONE SODIUM PHOSPHATE 10 MG/ML IJ SOLN
INTRAMUSCULAR | Status: AC
  Filled 2022-06-12: qty 1

## 2022-06-12 MED ORDER — LIDOCAINE HCL (CARDIAC) PF 100 MG/5ML IV SOSY
PREFILLED_SYRINGE | INTRAVENOUS | Status: DC | PRN
  Administered 2022-06-12: 50 mg via INTRAVENOUS

## 2022-06-12 MED ORDER — OXYCODONE HCL 5 MG PO TABS: 5.0000 mg | ORAL_TABLET | Freq: Once | ORAL | Status: DC | PRN

## 2022-06-12 MED ORDER — ONDANSETRON HCL 4 MG/2ML IJ SOLN
INTRAMUSCULAR | Status: DC | PRN
  Administered 2022-06-12: 4 mg via INTRAVENOUS

## 2022-06-12 MED ORDER — AMISULPRIDE (ANTIEMETIC) 5 MG/2ML IV SOLN: 10.0000 mg | Freq: Once | INTRAVENOUS | Status: DC | PRN

## 2022-06-12 MED ORDER — LIDOCAINE 2% (20 MG/ML) 5 ML SYRINGE
INTRAMUSCULAR | Status: AC
  Filled 2022-06-12: qty 5

## 2022-06-12 MED ORDER — HYDROMORPHONE HCL 1 MG/ML IJ SOLN: 0.2500 mg | INTRAMUSCULAR | Status: DC | PRN

## 2022-06-12 MED ORDER — EPHEDRINE 5 MG/ML INJ
INTRAVENOUS | Status: AC
  Filled 2022-06-12: qty 5

## 2022-06-12 MED ORDER — CEFAZOLIN SODIUM-DEXTROSE 2-4 GM/100ML-% IV SOLN
2.0000 g | INTRAVENOUS | Status: AC
  Administered 2022-06-12: 2 g via INTRAVENOUS

## 2022-06-12 MED ORDER — BUPIVACAINE HCL (PF) 0.25 % IJ SOLN
INTRAMUSCULAR | Status: DC | PRN
  Administered 2022-06-12: 8 mL

## 2022-06-12 MED ORDER — ACETAMINOPHEN 500 MG PO TABS
ORAL_TABLET | ORAL | Status: AC
  Filled 2022-06-12: qty 2

## 2022-06-12 MED ORDER — DEXAMETHASONE SODIUM PHOSPHATE 4 MG/ML IJ SOLN
INTRAMUSCULAR | Status: DC | PRN
  Administered 2022-06-12: 5 mg via INTRAVENOUS

## 2022-06-12 MED ORDER — PROMETHAZINE HCL 25 MG/ML IJ SOLN: 6.2500 mg | INTRAMUSCULAR | Status: DC | PRN

## 2022-06-12 MED ORDER — OXYCODONE HCL 5 MG/5ML PO SOLN: 5.0000 mg | Freq: Once | ORAL | Status: DC | PRN

## 2022-06-12 MED ORDER — PROPOFOL 10 MG/ML IV BOLUS
INTRAVENOUS | Status: DC | PRN
  Administered 2022-06-12: 200 mg via INTRAVENOUS

## 2022-06-12 MED ORDER — MIDAZOLAM HCL 5 MG/5ML IJ SOLN
INTRAMUSCULAR | Status: DC | PRN
  Administered 2022-06-12: 2 mg via INTRAVENOUS

## 2022-06-12 MED ORDER — ONDANSETRON HCL 4 MG/2ML IJ SOLN
INTRAMUSCULAR | Status: AC
  Filled 2022-06-12: qty 2

## 2022-06-12 MED ORDER — ACETAMINOPHEN 500 MG PO TABS
1000.0000 mg | ORAL_TABLET | ORAL | Status: AC
  Administered 2022-06-12: 1000 mg via ORAL

## 2022-06-12 MED ORDER — KETOROLAC TROMETHAMINE 15 MG/ML IJ SOLN
15.0000 mg | INTRAMUSCULAR | Status: AC
  Administered 2022-06-12: 15 mg via INTRAVENOUS

## 2022-06-12 SURGICAL SUPPLY — 53 items
ADH SKN CLS APL DERMABOND .7 (GAUZE/BANDAGES/DRESSINGS) ×1
APL PRP STRL LF DISP 70% ISPRP (MISCELLANEOUS) ×1
APPLIER CLIP 9.375 MED OPEN (MISCELLANEOUS)
APR CLP MED 9.3 20 MLT OPN (MISCELLANEOUS)
BINDER BREAST LRG (GAUZE/BANDAGES/DRESSINGS) IMPLANT
BINDER BREAST MEDIUM (GAUZE/BANDAGES/DRESSINGS) IMPLANT
BLADE SURG 15 STRL LF DISP TIS (BLADE) ×1 IMPLANT
BLADE SURG 15 STRL SS (BLADE) ×1
CANISTER SUC SOCK COL 7IN (MISCELLANEOUS) IMPLANT
CANISTER SUCT 1200ML W/VALVE (MISCELLANEOUS) IMPLANT
CHLORAPREP W/TINT 26 (MISCELLANEOUS) ×1 IMPLANT
CLIP APPLIE 9.375 MED OPEN (MISCELLANEOUS) IMPLANT
CLIP TI WIDE RED SMALL 6 (CLIP) IMPLANT
COVER BACK TABLE 60X90IN (DRAPES) ×1 IMPLANT
COVER MAYO STAND STRL (DRAPES) ×1 IMPLANT
COVER PROBE CYLINDRICAL 5X96 (MISCELLANEOUS) ×1 IMPLANT
DERMABOND ADVANCED .7 DNX12 (GAUZE/BANDAGES/DRESSINGS) ×1 IMPLANT
DRAPE LAPAROSCOPIC ABDOMINAL (DRAPES) ×1 IMPLANT
DRAPE UTILITY XL STRL (DRAPES) ×1 IMPLANT
ELECT COATED BLADE 2.86 ST (ELECTRODE) ×1 IMPLANT
ELECT REM PT RETURN 9FT ADLT (ELECTROSURGICAL) ×1
ELECTRODE REM PT RTRN 9FT ADLT (ELECTROSURGICAL) ×1 IMPLANT
GLOVE BIO SURGEON STRL SZ 6.5 (GLOVE) IMPLANT
GLOVE BIO SURGEON STRL SZ7 (GLOVE) ×2 IMPLANT
GLOVE BIOGEL PI IND STRL 7.0 (GLOVE) IMPLANT
GLOVE BIOGEL PI IND STRL 7.5 (GLOVE) ×1 IMPLANT
GLOVE SURG SS PI 6.5 STRL IVOR (GLOVE) IMPLANT
GOWN STRL REUS W/ TWL LRG LVL3 (GOWN DISPOSABLE) ×2 IMPLANT
GOWN STRL REUS W/TWL LRG LVL3 (GOWN DISPOSABLE) ×3
HEMOSTAT ARISTA ABSORB 3G PWDR (HEMOSTASIS) IMPLANT
KIT MARKER MARGIN INK (KITS) ×1 IMPLANT
NDL HYPO 25X1 1.5 SAFETY (NEEDLE) ×1 IMPLANT
NEEDLE HYPO 25X1 1.5 SAFETY (NEEDLE) ×1 IMPLANT
NS IRRIG 1000ML POUR BTL (IV SOLUTION) IMPLANT
PACK BASIN DAY SURGERY FS (CUSTOM PROCEDURE TRAY) ×1 IMPLANT
PENCIL SMOKE EVACUATOR (MISCELLANEOUS) ×1 IMPLANT
RETRACTOR ONETRAX LX 90X20 (MISCELLANEOUS) IMPLANT
SLEEVE SCD COMPRESS KNEE MED (STOCKING) ×1 IMPLANT
SPIKE FLUID TRANSFER (MISCELLANEOUS) IMPLANT
SPONGE T-LAP 4X18 ~~LOC~~+RFID (SPONGE) ×1 IMPLANT
STRIP CLOSURE SKIN 1/2X4 (GAUZE/BANDAGES/DRESSINGS) ×1 IMPLANT
SUT MNCRL AB 4-0 PS2 18 (SUTURE) IMPLANT
SUT MON AB 5-0 PS2 18 (SUTURE) IMPLANT
SUT SILK 2 0 SH (SUTURE) IMPLANT
SUT VIC AB 2-0 SH 27 (SUTURE) ×1
SUT VIC AB 2-0 SH 27XBRD (SUTURE) ×1 IMPLANT
SUT VIC AB 3-0 SH 27 (SUTURE) ×1
SUT VIC AB 3-0 SH 27X BRD (SUTURE) ×1 IMPLANT
SYR CONTROL 10ML LL (SYRINGE) ×1 IMPLANT
TOWEL GREEN STERILE FF (TOWEL DISPOSABLE) ×1 IMPLANT
TRAY FAXITRON CT DISP (TRAY / TRAY PROCEDURE) ×1 IMPLANT
TUBE CONNECTING 20X1/4 (TUBING) IMPLANT
YANKAUER SUCT BULB TIP NO VENT (SUCTIONS) IMPLANT

## 2022-06-12 NOTE — Transfer of Care (Signed)
Immediate Anesthesia Transfer of Care Note  Patient: Joan Sanchez  Procedure(s) Performed: RADIOACTIVE SEED GUIDED EXCISIONAL RIGHT BREAST BIOPSY (Right: Breast)  Patient Location: PACU  Anesthesia Type:General  Level of Consciousness: awake, alert , and oriented  Airway & Oxygen Therapy: Patient Spontanous Breathing and Patient connected to face mask oxygen  Post-op Assessment: Report given to RN and Post -op Vital signs reviewed and stable  Post vital signs: Reviewed and stable  Last Vitals:  Vitals Value Taken Time  BP 117/72 06/12/22 0837  Temp    Pulse 93 06/12/22 0839  Resp 20 06/12/22 0839  SpO2 100 % 06/12/22 0839  Vitals shown include unvalidated device data.  Last Pain:  Vitals:   06/12/22 0650  TempSrc: Oral  PainSc: 0-No pain         Complications: No notable events documented.

## 2022-06-12 NOTE — Anesthesia Procedure Notes (Signed)
Procedure Name: LMA Insertion Date/Time: 06/12/2022 7:40 AM  Performed by: Bufford Spikes, CRNAPre-anesthesia Checklist: Patient identified, Emergency Drugs available, Suction available and Patient being monitored Patient Re-evaluated:Patient Re-evaluated prior to induction Oxygen Delivery Method: Circle system utilized Preoxygenation: Pre-oxygenation with 100% oxygen Induction Type: IV induction Ventilation: Mask ventilation without difficulty LMA: LMA inserted LMA Size: 3.0 Number of attempts: 1 Placement Confirmation: positive ETCO2 Tube secured with: Tape Dental Injury: Teeth and Oropharynx as per pre-operative assessment

## 2022-06-12 NOTE — Discharge Instructions (Addendum)
[place] Office Phone Number [phone]  POST OP INSTRUCTIONS Take 400 mg of ibuprofen every 8 hours or 650 mg tylenol every 6 hours for next 72 hours then as needed. Use ice several times daily also.  A prescription for pain medication may be given to you upon discharge.  Take your pain medication as prescribed, if needed.  If narcotic pain medicine is not needed, then you may take acetaminophen (Tylenol), naprosyn (Alleve) or ibuprofen (Advil) as needed. Take your usually prescribed medications unless otherwise directed If you need a refill on your pain medication, please contact your pharmacy.  They will contact our office to request authorization.  Prescriptions will not be filled after 5pm or on week-ends. You should eat very light the first 24 hours after surgery, such as soup, crackers, pudding, etc.  Resume your normal diet the day after surgery. Most patients will experience some swelling and bruising in the breast.  Ice packs and a good support bra will help.  Wear the breast binder provided or a sports bra for 72 hours day and night.  After that wear a sports bra during the day until you return to the office. Swelling and bruising can take several days to resolve.  It is common to experience some constipation if taking pain medication after surgery.  Increasing fluid intake and taking a stool softener will usually help or prevent this problem from occurring.  A mild laxative (Milk of Magnesia or Miralax) should be taken according to package directions if there are no bowel movements after 48 hours. I used skin glue on the incision, you may shower in 24 hours.  The glue will flake off over the next 2-3 weeks.  Any sutures or staples will be removed at the office during your follow-up visit. ACTIVITIES:  You may resume regular daily activities (gradually increasing) beginning the next day.  Wearing a good support bra or sports bra minimizes pain and swelling.  You may have  sexual intercourse when it is comfortable. You may drive when you no longer are taking prescription pain medication, you can comfortably wear a seatbelt, and you can safely maneuver your car and apply brakes. RETURN TO WORK:  ______________________________________________________________________________________ Joan Sanchez should see your doctor in the office for a follow-up appointment approximately two weeks after your surgery.  Your doctor's nurse will typically make your follow-up appointment when she calls you with your pathology report.  Expect your pathology report 3-4 business days after your surgery.  You may call to check if you do not hear from [place] after three days. OTHER INSTRUCTIONS: _______________________________________________________________________________________________ _____________________________________________________________________________________________________________________________________ _____________________________________________________________________________________________________________________________________ _____________________________________________________________________________________________________________________________________  WHEN TO CALL DR [place]: Fever over 101.0 Nausea and/or vomiting. Extreme swelling or bruising. Continued bleeding from incision. Increased pain, redness, or drainage from the incision.  The clinic staff is available to answer your questions during regular business hours.  Please don't hesitate to call and ask to speak to one of the nurses for clinical concerns.  If you have a medical emergency, go to the nearest emergency room or call 911.  A surgeon from [facility] is always on call at the hospital.  For further questions, please visit [email or web address]   Post Anesthesia Home Care Instructions  Activity: Get plenty of rest for the remainder of the day. A responsible individual must stay  with you for 24 hours following the procedure.  For the next 24 hours, DO NOT: -Drive a car -Paediatric nurse -Drink alcoholic beverages -Take any medication unless instructed by  your physician -Make any legal decisions or sign important papers.  Meals: Start with liquid foods such as gelatin or soup. Progress to regular foods as tolerated. Avoid greasy, spicy, heavy foods. If nausea and/or vomiting occur, drink only clear liquids until the nausea and/or vomiting subsides. Call your physician if vomiting continues.  Special Instructions/Symptoms: Your throat may feel dry or sore from the anesthesia or the breathing tube placed in your throat during surgery. If this causes discomfort, gargle with warm salt water. The discomfort should disappear within 24 hours.  If you had a scopolamine patch placed behind your ear for the management of post- operative nausea and/or vomiting:  1. The medication in the patch is effective for 72 hours, after which it should be removed.  Wrap patch in a tissue and discard in the trash. Wash hands thoroughly with soap and water. 2. You may remove the patch earlier than 72 hours if you experience unpleasant side effects which may include dry mouth, dizziness or visual disturbances. 3. Avoid touching the patch. Wash your hands with soap and water after contact with the patch.    Post Anesthesia Home Care Instructions  Activity: Get plenty of rest for the remainder of the day. A responsible individual must stay with you for 24 hours following the procedure.  For the next 24 hours, DO NOT: -Drive a car -Paediatric nurse -Drink alcoholic beverages -Take any medication unless instructed by your physician -Make any legal decisions or sign important papers.  Meals: Start with liquid foods such as gelatin or soup. Progress to regular foods as tolerated. Avoid greasy, spicy, heavy foods. If nausea and/or vomiting occur, drink only clear liquids until the nausea  and/or vomiting subsides. Call your physician if vomiting continues.  Special Instructions/Symptoms: Your throat may feel dry or sore from the anesthesia or the breathing tube placed in your throat during surgery. If this causes discomfort, gargle with warm salt water. The discomfort should disappear within 24 hours.      Next dose of Ibuprofen or Tylenol can be taken today at 1pm if needed.

## 2022-06-12 NOTE — Anesthesia Preprocedure Evaluation (Signed)
Anesthesia Evaluation  Patient identified by MRN, date of birth, ID band Patient awake    Reviewed: Allergy & Precautions, NPO status , Patient's Chart, lab work & pertinent test results  Airway Mallampati: I  TM Distance: >3 FB Neck ROM: Full    Dental no notable dental hx. (+) Teeth Intact, Dental Advisory Given   Pulmonary neg pulmonary ROS   Pulmonary exam normal breath sounds clear to auscultation       Cardiovascular hypertension, Normal cardiovascular exam Rhythm:Regular Rate:Normal     Neuro/Psych negative neurological ROS  negative psych ROS   GI/Hepatic negative GI ROS, Neg liver ROS,,,  Endo/Other  negative endocrine ROS    Renal/GU negative Renal ROS  negative genitourinary   Musculoskeletal negative musculoskeletal ROS (+)    Abdominal   Peds  Hematology negative hematology ROS (+)   Anesthesia Other Findings IOL for severe preE on mag now c/f HELLP  Reproductive/Obstetrics                             Anesthesia Physical Anesthesia Plan  ASA: 2  Anesthesia Plan: General   Post-op Pain Management: Dilaudid IV and Tylenol PO (pre-op)*   Induction: Intravenous  PONV Risk Score and Plan: 3 and Treatment may vary due to age or medical condition, Ondansetron, Dexamethasone and Midazolam  Airway Management Planned: LMA  Additional Equipment:   Intra-op Plan:   Post-operative Plan: Extubation in OR  Informed Consent: I have reviewed the patients History and Physical, chart, labs and discussed the procedure including the risks, benefits and alternatives for the proposed anesthesia with the patient or authorized representative who has indicated his/her understanding and acceptance.       Plan Discussed with: Anesthesiologist  Anesthesia Plan Comments:         Anesthesia Quick Evaluation

## 2022-06-12 NOTE — Op Note (Signed)
Preoperative diagnosis: Right breast MRI abnormality, BRCA1 mutation Postoperative diagnosis: Same as above Procedure: Right breast radioactive seed guided excisional biopsy Surgeon: Dr. Serita Grammes Anesthesia: General Estimated blood loss: Minimal Specimens: Right breast tissue containing seed and clip confirmed by mammography, additional medial margin marked short superior, long lateral, double deep Complications: None Drains: None Sponge needle count was correct completion Disposition to recovery in stable condition  Indications: This is a 27 year old female who has BRCA1 mutation.  She began MRIs at age 54.  She initially was found to have a far posterior right breast area of non-mass enhancement.  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  It was decided that the area was not adequately sampled.  She was then brought back again underwent biopsy with mild fibrocystic change with rare calcifications.  There still is a concern that they did not sample this area so she was sent for excisional biopsy.  I discussed her case with the radiologist and we all elected to proceed with excision.  Procedure: After informed consent was obtained the patient was taken to the operating room.  She was given antibiotics.  SCDs were in place.  She was placed under general esthesia without complication.  She was prepped and draped in the standard sterile surgical fashion.  Surgical timeout was then performed.  Identify the seed in the central breast.  I infiltrated Marcaine.  I made a periareolar incision in order to hide the scar later.  I then dissected down to the seed.  The biopsy cavity was apparent.  I remove the seed and some of the surrounding tissue.  Mammogram confirmed removal of the seed as well as the barbell clip.  I did excise the rest of the biopsy cavity and this was the additional medial margin.  This was sent to pathology.  I then obtained hemostasis.  I closed the breast tissue with 2-0  Vicryl.  The skin was closed with 3-0 Vicryl and 5-0 Monocryl.  Glue and Steri-Strips were applied.  She tolerated this well was extubated and transferred to recovery stable.

## 2022-06-12 NOTE — Interval H&P Note (Signed)
History and Physical Interval Note:  06/12/2022 7:08 AM  Joan Sanchez  has presented today for surgery, with the diagnosis of RIGHT BREAST ABNORMAL MRI WITH MASS.  The various methods of treatment have been discussed with the patient and family. After consideration of risks, benefits and other options for treatment, the patient has consented to  Procedure(s): RADIOACTIVE SEED GUIDED EXCISIONAL RIGHT BREAST BIOPSY (Right) as a surgical intervention.  The patient's history has been reviewed, patient examined, no change in status, stable for surgery.  I have reviewed the patient's chart and labs.  Questions were answered to the patient's satisfaction.     Rolm Bookbinder

## 2022-06-12 NOTE — Anesthesia Postprocedure Evaluation (Signed)
Anesthesia Post Note  Patient: Joan Sanchez  Procedure(s) Performed: RADIOACTIVE SEED GUIDED EXCISIONAL RIGHT BREAST BIOPSY (Right: Breast)     Patient location during evaluation: PACU Anesthesia Type: General Level of consciousness: awake and alert Pain management: pain level controlled Vital Signs Assessment: post-procedure vital signs reviewed and stable Respiratory status: spontaneous breathing, nonlabored ventilation and respiratory function stable Cardiovascular status: blood pressure returned to baseline and stable Postop Assessment: no apparent nausea or vomiting Anesthetic complications: no   No notable events documented.  Last Vitals:  Vitals:   06/12/22 0900 06/12/22 0923  BP: 115/78 125/81  Pulse: (!) 103 100  Resp: 16 20  Temp:  36.6 C  SpO2: 100% 100%    Last Pain:  Vitals:   06/12/22 0923  TempSrc: Oral  PainSc: 0-No pain                 Lynda Rainwater

## 2022-06-12 NOTE — H&P (Signed)
She is a healthy 27 year old female who was found in 2020 to have a BRCA1 mutation. She has a family history significant for a mom with breast cancer at age 259 and then 3 maternal relatives who had ovarian cancer in their 36s. Her BRCA 1 mutation is c.[id number]>T (p.[id number]*). Outside of some breast pain she has had no issues with her either breast. 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 She is being seen by an OB/GYN to discuss her ovarian cancer risk. She is also followed by genetics at this point. She underwent her first MRI which was at age 27 On that she is found to have an area in the far posterior right breast that shows non-mass enhancement. The remainder of this is negative. She had D density breast tissue. She underwent an attempt at a biopsy of this. This was done on May 31. This area showed PPowderly But on images postprocedure it was decided that the targeted non-mass enhancement was not adequately sampled. She was then brought back again. This was in August. She had a another biopsy done at that point which showed mild fibrocystic change with rare calcifications. On the mammogram clip placement there was appropriate positioning of the marking clip at the site of biopsy corresponding to the targeted non-mass enhancement. This biopsy revealed mild fibrocystic change with rare calcifications and was found to be concordant. She then underwent a bilateral breast MRI in November 2023 for follow-up. This again shows D density breast tissue. The left breast is normal and her lymph nodes are normal. In the right breast far posteriorly in the 9:00 region there is an area of linear enhancement that is still present measuring 9 mm. Just medial to that there is a signal void from a marker clip. After this she was discussed with radiology that surgical excision is now recommended given his persistent despite the most recent MRI guided biopsy in August the thought was that even though the clip  is right next to it they are concerned about incomplete sampling of this area and do not think that they can access this with MRI guidance again. She is here today to discuss her options. She has no complaints referable to either breast. She is not breast-feeding currently. She does desire additional children. Her plan is to complete her childbearing by age 27 She understands that she needs ovarian cancer follow-up. We also did discuss today the role of risk-reducing surgery which I do not think she necessarily needs to consider until once she has completed her family.  Review of Systems: A complete review of systems was obtained from the patient. I have reviewed this information and discussed as appropriate with the patient. See HPI as well for other ROS.  Review of Systems  All other systems reviewed and are negative.   Medical History: History reviewed. No pertinent past medical history.  There is no problem list on file for this patient.  History reviewed. No pertinent surgical history.   No Known Allergies  No current outpatient medications on file prior to visit.   Social History   Tobacco Use  Smoking Status Never  Smokeless Tobacco Never  Marital status: Married  Tobacco Use  Smoking status: Never  Smokeless tobacco: Never  Substance and Sexual Activity  Alcohol use: Not Currently  Drug use: Never   Objective:   Vitals:  05/13/22 0935  BP: 138/80  Pulse: 96  Temp: 36.2 C (97.1 F)  SpO2: 96%  Weight: 47.3  kg (104 lb 3.2 oz)  Height: 156.2 cm (5' 1.5")   Body mass index is 19.37 kg/m.  Physical Exam Vitals reviewed.  Constitutional:  Appearance: Normal appearance.  Chest:  Breasts: Right: No inverted nipple, mass or nipple discharge.  Left: No inverted nipple, mass or nipple discharge.  Lymphadenopathy:  Upper Body:  Right upper body: No supraclavicular or axillary adenopathy.  Left upper body: No supraclavicular or axillary adenopathy.   Neurological:  Mental Status: She is alert.    Assessment and Plan:   BRCA1 gene mutation positive  Abnormal magnetic resonance imaging of right breast   We had a long discussion today about the MRI abnormality. I certainly think 1 option would be to follow this in 6 months. However with her history it certainly is reasonable to consider excision of this area we discussed the pros and cons of both approaches. Radiology is certainly recommended an excision of this due to their concern. I am going to reach out to them to see if I can discuss this with them in a little bit more detail. We the other complicating factor is that she is planning another pregnancy so this may impact our ability to follow her up. She is also concerned about this. With all of that information I think is probably going to be best to excise this area. We discussed a seed guided excision of this area as well as the risks and recovery. She is going to discuss this with her husband and consider options. I will discuss with radiology and then we will follow-up.  We discussed somewhat the role of risk reducing mastectomy which I told her is certainly not something that is mandatory right now. We discussed nipple sparing mastectomy with reconstruction at the same time. She is very interested in continuing her family and breast-feeding and I think it is perfectly reasonable to just follow her until she is ready to have this conversation

## 2022-06-13 ENCOUNTER — Encounter (HOSPITAL_BASED_OUTPATIENT_CLINIC_OR_DEPARTMENT_OTHER): Payer: Self-pay | Admitting: General Surgery

## 2022-06-16 LAB — SURGICAL PATHOLOGY

## 2022-07-29 ENCOUNTER — Encounter (HOSPITAL_COMMUNITY): Payer: Self-pay

## 2023-02-23 ENCOUNTER — Other Ambulatory Visit: Payer: Self-pay | Admitting: Nurse Practitioner

## 2023-02-23 DIAGNOSIS — Z8041 Family history of malignant neoplasm of ovary: Secondary | ICD-10-CM

## 2023-02-23 DIAGNOSIS — Z9189 Other specified personal risk factors, not elsewhere classified: Secondary | ICD-10-CM

## 2023-02-23 DIAGNOSIS — Z803 Family history of malignant neoplasm of breast: Secondary | ICD-10-CM

## 2023-05-01 ENCOUNTER — Ambulatory Visit
Admission: RE | Admit: 2023-05-01 | Discharge: 2023-05-01 | Disposition: A | Discharge status: Home / Self Care | Payer: 59 | Source: Ambulatory Visit | Attending: Nurse Practitioner | Admitting: Nurse Practitioner

## 2023-05-01 DIAGNOSIS — Z9189 Other specified personal risk factors, not elsewhere classified: Secondary | ICD-10-CM

## 2023-05-01 DIAGNOSIS — Z803 Family history of malignant neoplasm of breast: Secondary | ICD-10-CM

## 2023-05-01 DIAGNOSIS — Z8041 Family history of malignant neoplasm of ovary: Secondary | ICD-10-CM

## 2023-05-01 MED ORDER — GADOPICLENOL 0.5 MMOL/ML IV SOLN
5.0000 mL | Freq: Once | INTRAVENOUS | Status: AC | PRN
  Administered 2023-05-01: 5 mL via INTRAVENOUS

## 2023-06-10 NOTE — L&D Delivery Note (Signed)
 Delivery Note Joan Sanchez is a G2P0101 at [redacted]w[redacted]d who had a spontaneous delivery at 0154 a viable female [name] was delivered via OA.  APGAR: 7, 8 ; weight 6lb13oz (3090g) .     Admitted for severe preeclampsia. Induced with pitocin , AROM. Progressed slowly. Received epidural for pain management. Fully dilated at 0030. Pushed for 1 hr 20  minutes. Baby was delivered without difficulty. No nuchal cord.  Delayed cord clamping for > 60 seconds per patient request.  Delivery of placenta was spontaneous. Placenta was found to be intact, 3 -vessel cord was noted. The fundus was found to be firm. 1st degree perineal laceration was repaired in the normal sterile fashion with 3-0 vicryl after infiltration with 10cc of 1% lidocaine  for patient comfort. DRE with good rectal tone and no sutures. Estimated blood loss 350cc. Instrument and gauze counts were correct at the end of the procedure.   Placenta status: to pathology  Anesthesia:  epidural, 1% lidocaine  Episiotomy:  none Lacerations:  1st degree perineal Suture Repair: 3.0 vicryl Est. Blood Loss (mL):  350  Mom to postpartum.  Baby to Couplet care / Skin to Skin.  Joan Sanchez Chapel 02/27/2024, 2:54 AM

## 2023-08-14 LAB — OB RESULTS CONSOLE ANTIBODY SCREEN: Antibody Screen: NEGATIVE

## 2023-08-14 LAB — OB RESULTS CONSOLE RUBELLA ANTIBODY, IGM: Rubella: IMMUNE

## 2023-08-14 LAB — OB RESULTS CONSOLE RPR: RPR: NONREACTIVE

## 2023-08-14 LAB — OB RESULTS CONSOLE HEPATITIS B SURFACE ANTIGEN: Hepatitis B Surface Ag: NEGATIVE

## 2023-08-14 LAB — OB RESULTS CONSOLE GC/CHLAMYDIA
Chlamydia: NEGATIVE
Neisseria Gonorrhea: NEGATIVE

## 2023-08-14 LAB — HEPATITIS C ANTIBODY: HCV Ab: NEGATIVE

## 2023-08-14 LAB — OB RESULTS CONSOLE HIV ANTIBODY (ROUTINE TESTING): HIV: NONREACTIVE

## 2023-08-14 LAB — OB RESULTS CONSOLE GBS: GBS: POSITIVE

## 2024-01-25 ENCOUNTER — Telehealth: Payer: Self-pay

## 2024-01-25 NOTE — Telephone Encounter (Signed)
 Copied from CRM #[id number]. Topic: Referral - Status >> Jan 25, 2024  9:45 AM Nathanel BROCKS wrote: Reason for CRM: Candance from [facility] is calling in regards to the request for iron  infusions. She was told that it would be October before we could get Joan Sanchez in. She is pregnant and is due October and needs the infustions now. Is there anyway to get her in now? Please advise. [phone] Candance or call pt.

## 2024-01-25 NOTE — Telephone Encounter (Signed)
 Copied from CRM #[id number]. Topic: General - Other >> Jan 25, 2024 12:20 PM Cleave MATSU wrote: Reason for CRM: pt had a missed call she was returning call please follow back up with pt.

## 2024-01-25 NOTE — Telephone Encounter (Signed)
 Copied from CRM [id number]. Topic: Clinical - Prescription Issue >> Jan 21, 2024  3:55 PM Emylou G wrote: Reason for CRM: Patient called.. said needs to do iron  infusion.. per OB.SABRA Pls call her to schedule >> Jan 21, 2024  4:00 PM Emylou G wrote: Can she be seen w/ referral.. she isn't a patient.. needed to add this on.

## 2024-01-29 ENCOUNTER — Inpatient Hospital Stay (HOSPITAL_COMMUNITY)
Admission: AD | Admit: 2024-01-29 | Discharge: 2024-01-29 | Disposition: A | Discharge status: Home / Self Care | Attending: Student | Admitting: Student

## 2024-01-29 ENCOUNTER — Encounter (HOSPITAL_COMMUNITY): Payer: Self-pay

## 2024-01-29 ENCOUNTER — Non-Acute Institutional Stay (HOSPITAL_COMMUNITY)
Admission: RE | Admit: 2024-01-29 | Discharge: 2024-01-29 | Disposition: A | Discharge status: Home / Self Care | Payer: Self-pay | Source: Ambulatory Visit | Attending: Internal Medicine | Admitting: Internal Medicine

## 2024-01-29 DIAGNOSIS — Z3A00 Weeks of gestation of pregnancy not specified: Secondary | ICD-10-CM | POA: Insufficient documentation

## 2024-01-29 DIAGNOSIS — O99013 Anemia complicating pregnancy, third trimester: Secondary | ICD-10-CM | POA: Insufficient documentation

## 2024-01-29 DIAGNOSIS — Z3A33 33 weeks gestation of pregnancy: Secondary | ICD-10-CM

## 2024-01-29 DIAGNOSIS — O1403 Mild to moderate pre-eclampsia, third trimester: Secondary | ICD-10-CM | POA: Insufficient documentation

## 2024-01-29 DIAGNOSIS — O133 Gestational [pregnancy-induced] hypertension without significant proteinuria, third trimester: Secondary | ICD-10-CM

## 2024-01-29 DIAGNOSIS — O1493 Unspecified pre-eclampsia, third trimester: Secondary | ICD-10-CM

## 2024-01-29 DIAGNOSIS — Z3689 Encounter for other specified antenatal screening: Secondary | ICD-10-CM | POA: Diagnosis not present

## 2024-01-29 LAB — CBC
HCT: 26.6 % — ABNORMAL LOW (ref 36.0–46.0)
Hemoglobin: 9.4 g/dL — ABNORMAL LOW (ref 12.0–15.0)
MCH: 31.1 pg (ref 26.0–34.0)
MCHC: 35.3 g/dL (ref 30.0–36.0)
MCV: 88.1 fL (ref 80.0–100.0)
Platelets: 164 K/uL (ref 150–400)
RBC: 3.02 MIL/uL — ABNORMAL LOW (ref 3.87–5.11)
RDW: 13.8 % (ref 11.5–15.5)
WBC: 8.8 K/uL (ref 4.0–10.5)
nRBC: 0 % (ref 0.0–0.2)

## 2024-01-29 LAB — COMPREHENSIVE METABOLIC PANEL WITH GFR
ALT: 12 U/L (ref 0–44)
AST: 24 U/L (ref 15–41)
Albumin: 2.7 g/dL — ABNORMAL LOW (ref 3.5–5.0)
Alkaline Phosphatase: 100 U/L (ref 38–126)
Anion gap: 6 (ref 5–15)
BUN: <5 mg/dL — ABNORMAL LOW (ref 6–20)
CO2: 23 mmol/L (ref 22–32)
Calcium: 8.9 mg/dL (ref 8.9–10.3)
Chloride: 107 mmol/L (ref 98–111)
Creatinine, Ser: 0.72 mg/dL (ref 0.44–1.00)
GFR, Estimated: >60 mL/min (ref >60)
Glucose, Bld: 91 mg/dL (ref 70–99)
Potassium: 3.6 mmol/L (ref 3.5–5.1)
Sodium: 136 mmol/L (ref 135–145)
Total Bilirubin: 0.6 mg/dL (ref 0.0–1.2)
Total Protein: 5.8 g/dL — ABNORMAL LOW (ref 6.5–8.1)

## 2024-01-29 LAB — PROTEIN / CREATININE RATIO, URINE
Creatinine, Urine: 46 mg/dL
Protein Creatinine Ratio: 0.33 mg/mg{creat} — ABNORMAL HIGH (ref 0.00–0.15)
Total Protein, Urine: 15 mg/dL

## 2024-01-29 MED ORDER — IRON SUCROSE 500 MG IVPB - SIMPLE MED
500.0000 mg | Freq: Once | INTRAVENOUS | Status: AC
  Administered 2024-01-29: 500 mg via INTRAVENOUS
  Filled 2024-01-29: qty 500

## 2024-01-29 MED ORDER — SODIUM CHLORIDE 0.9 % IV SOLN: INTRAVENOUS | Status: DC | PRN

## 2024-01-29 NOTE — Discharge Instructions (Signed)
 F/U for BP check 02/01/24 with your OB

## 2024-01-29 NOTE — MAU Provider Note (Addendum)
 Chief Complaint: Hypertension   Event Date/Time   First Provider Initiated Contact with Patient 01/29/24 1434     SUBJECTIVE HPI: Joan Sanchez is a 28 y.o. G2P0101 at [redacted]w[redacted]d who presents to Maternity Admissions reporting hypertensive episode at home this morning. Per pt her sBP was in 140s at home and it kept elevated as she retake the BP. Pt w/ PMH of pre-eclampsia during last pregnancy and has been taking ASA daily during this pregnancy. Pt denies RUQ pain, N/V, LE swollen, HA, or change in vision.   Past Medical History:  Diagnosis Date   Abnormal blood creatinine level    Anxiety    Family history of BRCA1 gene positive    Family history of breast cancer    Family history of ovarian cancer    Medical history non-contributory    Severe preeclampsia    OB History  Gravida Para Term Preterm AB Living  2 1  1  1   SAB IAB Ectopic Multiple Live Births     0 1    # Outcome Date GA Lbr Len/2nd Weight Sex Type Anes PTL Lv  2 Current           1 Preterm 08/23/20 [redacted]w[redacted]d / 01:22 2690 g F Vag-Spont EPI  LIV   Past Surgical History:  Procedure Laterality Date   BREAST BIOPSY Right 11/06/2021   BREAST BIOPSY Right 01/27/2022   BREAST BIOPSY  06/11/2022   MM RT RADIOACTIVE SEED LOC MAMMO GUIDE 06/11/2022 [facility]MO[facility]   RADIOACTIVE SEED GUIDED EXCISIONAL BREAST BIOPSY Right 06/12/2022   Procedure: RADIOACTIVE SEED GUIDED EXCISIONAL RIGHT BREAST BIOPSY;  Surgeon: Ebbie Cough, MD;  Location: [facility];  Service: General;  Laterality: Right;   WISDOM TOOTH EXTRACTION     04/2022   Social History   Socioeconomic History   Marital status: Married    Spouse name: Rankin Kitty   Number of children: 0   Years of education: Not on file   Highest education level: Not on file  Occupational History   Occupation: Chief Strategy Officer- [employer]  Tobacco Use   Smoking status: Never   Smokeless tobacco: Never  Vaping Use   Vaping status: Never Used  Substance  and Sexual Activity   Alcohol use: Not Currently    Comment: socially   Drug use: No   Sexual activity: Yes  Other Topics Concern   Not on file  Social History Narrative   Not on file   Social Drivers of Health   Financial Resource Strain: Low Risk  (10/07/2022)   Received from [facility]   Overall Financial Resource Strain (CARDIA)    Difficulty of Paying Living Expenses: Not hard at all  Food Insecurity: No Food Insecurity (10/07/2022)   Received from [facility]   Hunger Vital Sign    Within the past 12 months, you worried that your food would run out before you got the money to buy more.: Never true    Within the past 12 months, the food you bought just didn't last and you didn't have money to get more.: Never true  Transportation Needs: No Transportation Needs (10/07/2022)   Received from [facility] - Transportation    Lack of Transportation (Medical): No    Lack of Transportation (Non-Medical): No  Physical Activity: Sufficiently Active (10/07/2022)   Received from [facility]   Exercise Vital Sign    On average, how many days per week do you engage in moderate to strenuous  exercise (like a brisk walk)?: 7 days    On average, how many minutes do you engage in exercise at this level?: 30 min  Stress: No Stress Concern Present (10/07/2022)   Received from [facility] - Occupational Stress Questionnaire    Feeling of Stress : Not at all  Social Connections: Socially Integrated (10/07/2022)   Received from [facility]   Social Network    How would you rate your social network (family, work, friends)?: Good participation with social networks  Intimate Partner Violence: Not At Risk (10/07/2022)   Received from [facility]   HITS    Over the last 12 months how often did your partner physically hurt you?: Never    Over the last 12 months how often did your partner insult you or talk down to you?: Never    Over the  last 12 months how often did your partner threaten you with physical harm?: Never    Over the last 12 months how often did your partner scream or curse at you?: Never   Family History  Problem Relation Age of Onset   Breast cancer Mother 53       BRCA1 positive   Ovarian cancer Other        MGGM's 2 sisters also had ovarian cancer   Ovarian cancer Maternal Great-grandmother    Current Facility-Administered Medications on File Prior to Encounter  Medication Dose Route Frequency Provider Last Rate Last Admin   0.9 %  sodium chloride  infusion   Intravenous PRN Claire Rubie LABOR, MD   Stopped at 01/29/24 1310   Current Outpatient Medications on File Prior to Encounter  Medication Sig Dispense Refill   aspirin 81 MG chewable tablet Chew 81 mg by mouth daily.     ferrous sulfate 325 (65 FE) MG tablet Take 325 mg by mouth daily with breakfast.     Prenatal Vit-Fe Fumarate-FA (MULTIVITAMIN-PRENATAL) 27-0.8 MG TABS tablet Take 1 tablet by mouth daily at 12 noon.     ibuprofen  (ADVIL ) 600 MG tablet Take 1 tablet (600 mg total) by mouth every 6 (six) hours as needed. 90 tablet 0   No Known Allergies  I have reviewed patient's Past Medical Hx, Surgical Hx, Family Hx, Social Hx, medications and allergies.   Review of Systems  Respiratory: Negative.    Cardiovascular: Negative.   Gastrointestinal: Negative.   Genitourinary: Negative.   Musculoskeletal: Negative.     OBJECTIVE Patient Vitals for the past 24 hrs:  BP Pulse Resp SpO2 Height Weight  01/29/24 1715 128/86 74 -- -- -- --  01/29/24 1700 138/87 74 -- -- -- --  01/29/24 1645 135/89 75 -- -- -- --  01/29/24 1630 138/83 85 -- -- -- --  01/29/24 1615 (!) 137/91 71 -- -- -- --  01/29/24 1600 (!) 128/90 79 -- 100 % -- --  01/29/24 1545 131/89 77 -- 100 % -- --  01/29/24 1530 (!) 127/95 75 -- 100 % -- --  01/29/24 1515 129/89 78 -- 100 % -- --  01/29/24 1500 134/86 77 -- 100 % -- --  01/29/24 1445 (!) 134/94 73 -- 100 % -- --   01/29/24 1430 131/84 72 -- 100 % -- --  01/29/24 1425 (!) 131/101 69 16 100 % 5' 1 (1.549 m) 56.7 kg   Constitutional: Well-developed, well-nourished female in no acute distress.  Cardiovascular: normal rate & rhythm, no murmur Respiratory: normal rate and effort. Lung sounds clear  throughout GI: Abd soft, non-tender, Pos BS x 4. No guarding or rebound tenderness MS: Extremities nontender, no edema, normal ROM Neurologic: Alert and oriented x 4.     FHRT, Cat 1 reactive, 130 baseline, mod variability  accels, no decels, toco, quite  LAB RESULTS Results for orders placed or performed during the hospital encounter of 01/29/24 (from the past 24 hours)  Protein / creatinine ratio, urine     Status: Abnormal   Collection Time: 01/29/24  2:36 PM  Result Value Ref Range   Creatinine, Urine 46 mg/dL   Total Protein, Urine 15 mg/dL   Protein Creatinine Ratio 0.33 (H) 0.00 - 0.15 mg/mg[Cre]  CBC     Status: Abnormal   Collection Time: 01/29/24  3:01 PM  Result Value Ref Range   WBC 8.8 4.0 - 10.5 K/uL   RBC 3.02 (L) 3.87 - 5.11 MIL/uL   Hemoglobin 9.4 (L) 12.0 - 15.0 g/dL   HCT 73.3 (L) 63.9 - 53.9 %   MCV 88.1 80.0 - 100.0 fL   MCH 31.1 26.0 - 34.0 pg   MCHC 35.3 30.0 - 36.0 g/dL   RDW 86.1 88.4 - 84.4 %   Platelets 164 150 - 400 K/uL   nRBC 0.0 0.0 - 0.2 %  Comprehensive metabolic panel     Status: Abnormal   Collection Time: 01/29/24  3:01 PM  Result Value Ref Range   Sodium 136 135 - 145 mmol/L   Potassium 3.6 3.5 - 5.1 mmol/L   Chloride 107 98 - 111 mmol/L   CO2 23 22 - 32 mmol/L   Glucose, Bld 91 70 - 99 mg/dL   BUN <5 (L) 6 - 20 mg/dL   Creatinine, Ser 9.27 0.44 - 1.00 mg/dL   Calcium 8.9 8.9 - 89.6 mg/dL   Total Protein 5.8 (L) 6.5 - 8.1 g/dL   Albumin 2.7 (L) 3.5 - 5.0 g/dL   AST 24 15 - 41 U/L   ALT 12 0 - 44 U/L   Alkaline Phosphatase 100 38 - 126 U/L   Total Bilirubin 0.6 0.0 - 1.2 mg/dL   GFR, Estimated >39 >39 mL/min   Anion gap 6 5 - 15    IMAGING No  results found.  MAU COURSE Orders Placed This Encounter  Procedures   CBC   Comprehensive metabolic panel   Protein / creatinine ratio, urine   No orders of the defined types were placed in this encounter.   MDM Ruled in for  preeclampsia w/o SF  - CBC - CMP - Urine protein/cr - Ratio 0.33   ASSESSMENT/PLAN 1. Preeclampsia, third trimester w/o severe feature   2. [redacted] weeks gestation of pregnancy         1. Preeclampsia, third trimester w/o severe feature (Primary) Patient sBP in 130s - 140s last 12 hours which met criteria for preeclampsia w/o severe features.  - CMP WNL  - Protein Urea ratio 0.33 indicated Preeclampsia  - Asymptomatic  - Preeclampsia precautions reviewed with patient - Follow up with [facility] for BP check on Monday (02/01/24) - Plan for  Antenatal testing  - 37 week IOL unless develops PreE with SF  - Discharge home with strict precautions - Dr Claudene [facility] Private Attending aware of patient status )  2. [redacted] weeks gestation of pregnancy      Follow-up Information     [facility] Follow up.   Specialty: Internal Medicine Why: as scheduled Contact information: [phone] [place]  [phone]               Allergies as of 01/29/2024   No Known Allergies      Suzen Houston NOVAK, DO PGY 1 Family Medicine resident 01/29/2024 5:23 PM   -------------------------------------  Attestation of Supervision of Student:  I confirm that I have verified the information documented in the Resident Physician's  student's note and that I have also personally reperformed the history, physical exam and all medical decision making activities.  I have verified that all services and findings are accurately documented in this student's note; and I agree with management and plan as outlined in the documentation. I have also made any necessary editorial  changes. --------------------------------------------------------------------------------------------------------------------  Olam DELENA Dalton, NP [facility] [employer], [facility] 01/29/2024 7:53 PM

## 2024-01-29 NOTE — MAU Note (Signed)
..  Joan Sanchez is a 28 y.o. at [redacted]w[redacted]d here in MAU reporting: had a high blood pressure reading at home this morning 144/89. Was also elevated at iron  infusion appointment this morning. Was told by OB to come have a pre-E workup.   Hx of preeclampsia with G1 at 36 weeks.   Pain score: no pain Vitals:   01/29/24 1425  BP: (!) 131/101  Pulse: 69  Resp: 16  SpO2: 100%     FHT:130 Lab orders placed from triage:  na

## 2024-01-29 NOTE — Progress Notes (Signed)
 PATIENT CARE CENTER NOTE   Diagnosis: Anemia complicating pregnancy, third trimester O99.013   Provider: Claire Raman, MD   Procedure: Venofer  infusion    Note: Patient received Venofer  500 mg infusion (dose # 1 of 2) via PIV. Patient tolerated infusion well with no adverse reaction. Patient has a history of pre-eclampsia with previous pregnancy. BP elevated pre (134/94) and post (138/95) infusion but patient has already contacted her doctor and has an appointment with her Obgyn after this infusion. Discharge instructions given. Patient to come back in 1 week for second infusion and will schedule next appointment at the front desk. Patient is alert, oriented and ambulatory at discharge. Discharged home with husband.

## 2024-02-01 ENCOUNTER — Inpatient Hospital Stay (HOSPITAL_COMMUNITY)
Admission: AD | Admit: 2024-02-01 | Discharge: 2024-02-01 | Disposition: A | Discharge status: Home / Self Care | Attending: Obstetrics and Gynecology | Admitting: Obstetrics and Gynecology

## 2024-02-01 DIAGNOSIS — O1403 Mild to moderate pre-eclampsia, third trimester: Secondary | ICD-10-CM | POA: Insufficient documentation

## 2024-02-01 DIAGNOSIS — Z3A34 34 weeks gestation of pregnancy: Secondary | ICD-10-CM | POA: Insufficient documentation

## 2024-02-01 MED ORDER — BETAMETHASONE SOD PHOS & ACET 6 (3-3) MG/ML IJ SUSP
12.0000 mg | Freq: Once | INTRAMUSCULAR | Status: AC
  Administered 2024-02-01: 12 mg via INTRAMUSCULAR
  Filled 2024-02-01: qty 5

## 2024-02-01 NOTE — MAU Note (Addendum)
 Joan Sanchez is a 28 y.o. at [redacted]w[redacted]d here in MAU reporting: had elevated BP when in MAU on Friday.  Hx Pre E.  Office called her this morning and instructed her to come here for dose of Betamethasone  today and tomorrow. (They do not give in office.  Denies HA, visual changes, epigastric pain or swelling.  Denies vag bleeding or LOF.  Reports +FM.   Onset of complaint: Friday Pain score: none Vitals:   02/01/24 1144  BP: 136/88  Resp: 17  Temp: 98.9 F (37.2 C)  SpO2: 100%     FHT:148, +fm noted Lab orders placed from triage:

## 2024-02-01 NOTE — MAU Provider Note (Signed)
 None     S [name] is a 28 y.o. G2P0101 patient who presents to MAU today with complaint of needing betamethasone  injection.   Patient seen in MAU earlier this week with elevated BP's After being seen in office she was sent to MAU today for betamethasone  injections Reports she feels well, denies headache, vision changes, chest pain, shortness of breath, or right upper quadrant pain Reports BP's at home have been normal since MAU visit  O BP 136/88 (BP Location: Right Arm)   Temp 98.9 F (37.2 C) (Oral)   Resp 17   Ht 5' 1 (1.549 m)   Wt 58.8 kg   SpO2 100%   BMI 24.49 kg/m  Physical Exam Vitals reviewed.  Constitutional:      General: She is not in acute distress.    Appearance: She is well-developed. She is not diaphoretic.  Eyes:     General: No scleral icterus. Pulmonary:     Effort: Pulmonary effort is normal. No respiratory distress.  Skin:    General: Skin is warm and dry.  Neurological:     Mental Status: She is alert.     Coordination: Coordination normal.     A Medical screening exam complete Mild pre-eclampsia  P Patient to be given betamethasone  per private physician orders Will return tomorrow for second dose Discharged from MAU in stable condition  Lola Donnice HERO, MD 02/01/2024 12:22 PM

## 2024-02-02 ENCOUNTER — Inpatient Hospital Stay (HOSPITAL_COMMUNITY)
Admission: AD | Admit: 2024-02-02 | Discharge: 2024-02-02 | Disposition: A | Discharge status: Home / Self Care | Attending: Obstetrics and Gynecology | Admitting: Obstetrics and Gynecology

## 2024-02-02 ENCOUNTER — Other Ambulatory Visit: Payer: Self-pay

## 2024-02-02 DIAGNOSIS — Z3A34 34 weeks gestation of pregnancy: Secondary | ICD-10-CM | POA: Diagnosis not present

## 2024-02-02 DIAGNOSIS — O1403 Mild to moderate pre-eclampsia, third trimester: Secondary | ICD-10-CM | POA: Diagnosis not present

## 2024-02-02 MED ORDER — BETAMETHASONE SOD PHOS & ACET 6 (3-3) MG/ML IJ SUSP
12.0000 mg | Freq: Once | INTRAMUSCULAR | Status: AC
  Administered 2024-02-02: 12 mg via INTRAMUSCULAR

## 2024-02-02 NOTE — MAU Note (Signed)
 Joan Sanchez is a 28 y.o. at [redacted]w[redacted]d here in MAU reporting: needing second BMZ shot. NO c/o SROM, vaginal bleeding, contractions, HA, chest pain, or visual disturbances.   +FM   LMP:  Onset of complaint: today Pain score: 0/10 Vitals:   02/02/24 1132  BP: 136/85  Pulse: 90  Resp: 16  SpO2: 100%     FHT: 150  Lab orders placed from triage: sign and hold

## 2024-02-02 NOTE — MAU Provider Note (Signed)
 None     S [name] is a 28 y.o. G2P0101 patient who presents to MAU today with complaint of needing betamethasone  injection.  Seen in MAU, diagnosed w mild pre-eclampsia. Received first dose of BMZ on 8/25, here for second dose today.  No VB, LOF, ctx. Normal FM.   O BP 136/85 (BP Location: Right Arm)   Pulse 90   Resp 16   SpO2 100%  Physical Exam Constitutional:      General: She is not in acute distress.    Appearance: Normal appearance. She is not ill-appearing.  HENT:     Head: Normocephalic and atraumatic.  Cardiovascular:     Rate and Rhythm: Normal rate.  Pulmonary:     Effort: Pulmonary effort is normal.     Breath sounds: Normal breath sounds.  Abdominal:     Palpations: Abdomen is soft.  Musculoskeletal:        General: Normal range of motion.  Skin:    General: Skin is warm and dry.     Findings: No rash.  Neurological:     General: No focal deficit present.     Mental Status: She is alert and oriented to person, place, and time.     A Medical screening exam complete Mild pre-eclampsia  P BMZ #2 received, tolerated well Discharge from MAU in stable condition Warning signs for worsening condition that would warrant emergency follow-up discussed Patient may return to MAU as needed    Von Reasoner, MD 02/02/2024 11:48 AM

## 2024-02-04 ENCOUNTER — Non-Acute Institutional Stay (HOSPITAL_COMMUNITY)
Admission: RE | Admit: 2024-02-04 | Discharge: 2024-02-04 | Disposition: A | Discharge status: Home / Self Care | Source: Ambulatory Visit | Attending: Internal Medicine | Admitting: Internal Medicine

## 2024-02-04 DIAGNOSIS — O99013 Anemia complicating pregnancy, third trimester: Secondary | ICD-10-CM | POA: Insufficient documentation

## 2024-02-04 DIAGNOSIS — D649 Anemia, unspecified: Secondary | ICD-10-CM | POA: Insufficient documentation

## 2024-02-04 MED ORDER — SODIUM CHLORIDE 0.9 % IV SOLN: INTRAVENOUS | Status: DC | PRN

## 2024-02-04 MED ORDER — IRON SUCROSE 500 MG IVPB - SIMPLE MED
500.0000 mg | Freq: Once | INTRAVENOUS | Status: DC
  Filled 2024-02-04: qty 275

## 2024-02-04 MED ORDER — SODIUM CHLORIDE 0.9 % IV SOLN
500.0000 mg | Freq: Once | INTRAVENOUS | Status: AC
  Administered 2024-02-04: 500 mg via INTRAVENOUS
  Filled 2024-02-04: qty 25

## 2024-02-04 NOTE — Progress Notes (Signed)
 PATIENT CARE CENTER NOTE     Diagnosis: Anemia complicating pregnancy, third trimester O99.013     Provider: Claire Raman, MD     Procedure: Venofer  infusion      Note: Patient received Venofer  500 mg infusion (dose # 2 of 2) via PIV. Patient tolerated infusion well with no adverse reaction. Patient's BP and vital signs stable. Printed AVS offered to patient but patient declined. Patient is alert, oriented and ambulatory at discharge.

## 2024-02-23 ENCOUNTER — Telehealth (HOSPITAL_COMMUNITY): Payer: Self-pay | Admitting: *Deleted

## 2024-02-23 ENCOUNTER — Encounter (HOSPITAL_COMMUNITY): Payer: Self-pay | Admitting: *Deleted

## 2024-02-23 NOTE — Telephone Encounter (Signed)
 Preadmission screen

## 2024-02-25 ENCOUNTER — Inpatient Hospital Stay (HOSPITAL_COMMUNITY): Admission: RE | Admit: 2024-02-25 | Payer: Self-pay | Source: Home / Self Care

## 2024-02-25 ENCOUNTER — Inpatient Hospital Stay (HOSPITAL_COMMUNITY): Attending: Obstetrics and Gynecology

## 2024-02-25 ENCOUNTER — Other Ambulatory Visit: Payer: Self-pay

## 2024-02-25 ENCOUNTER — Encounter (HOSPITAL_COMMUNITY): Payer: Self-pay | Admitting: Obstetrics and Gynecology

## 2024-02-25 ENCOUNTER — Inpatient Hospital Stay (HOSPITAL_COMMUNITY)
Admission: AD | Admit: 2024-02-25 | Discharge: 2024-03-01 | DRG: 806 | Disposition: A | Discharge status: Home / Self Care | Attending: Obstetrics and Gynecology | Admitting: Obstetrics and Gynecology

## 2024-02-25 DIAGNOSIS — O99824 Streptococcus B carrier state complicating childbirth: Secondary | ICD-10-CM | POA: Diagnosis present

## 2024-02-25 DIAGNOSIS — O9912 Other diseases of the blood and blood-forming organs and certain disorders involving the immune mechanism complicating childbirth: Secondary | ICD-10-CM | POA: Diagnosis present

## 2024-02-25 DIAGNOSIS — O1493 Unspecified pre-eclampsia, third trimester: Principal | ICD-10-CM | POA: Diagnosis present

## 2024-02-25 DIAGNOSIS — O9081 Anemia of the puerperium: Secondary | ICD-10-CM | POA: Diagnosis present

## 2024-02-25 DIAGNOSIS — Z880 Allergy status to penicillin: Secondary | ICD-10-CM | POA: Diagnosis not present

## 2024-02-25 DIAGNOSIS — Z6791 Unspecified blood type, Rh negative: Secondary | ICD-10-CM | POA: Diagnosis not present

## 2024-02-25 DIAGNOSIS — O26893 Other specified pregnancy related conditions, third trimester: Secondary | ICD-10-CM | POA: Diagnosis present

## 2024-02-25 DIAGNOSIS — Z3A37 37 weeks gestation of pregnancy: Secondary | ICD-10-CM | POA: Diagnosis not present

## 2024-02-25 DIAGNOSIS — O1414 Severe pre-eclampsia complicating childbirth: Secondary | ICD-10-CM | POA: Diagnosis present

## 2024-02-25 DIAGNOSIS — R030 Elevated blood-pressure reading, without diagnosis of hypertension: Secondary | ICD-10-CM | POA: Diagnosis present

## 2024-02-25 DIAGNOSIS — E871 Hypo-osmolality and hyponatremia: Secondary | ICD-10-CM | POA: Diagnosis present

## 2024-02-25 DIAGNOSIS — D696 Thrombocytopenia, unspecified: Secondary | ICD-10-CM | POA: Diagnosis present

## 2024-02-25 DIAGNOSIS — D62 Acute posthemorrhagic anemia: Secondary | ICD-10-CM | POA: Diagnosis present

## 2024-02-25 DIAGNOSIS — O99284 Endocrine, nutritional and metabolic diseases complicating childbirth: Secondary | ICD-10-CM | POA: Diagnosis present

## 2024-02-25 LAB — CBC
HCT: 31.8 % — ABNORMAL LOW (ref 36.0–46.0)
Hemoglobin: 11.1 g/dL — ABNORMAL LOW (ref 12.0–15.0)
MCH: 30.7 pg (ref 26.0–34.0)
MCHC: 34.9 g/dL (ref 30.0–36.0)
MCV: 88.1 fL (ref 80.0–100.0)
Platelets: 146 K/uL — ABNORMAL LOW (ref 150–400)
RBC: 3.61 MIL/uL — ABNORMAL LOW (ref 3.87–5.11)
RDW: 13.9 % (ref 11.5–15.5)
WBC: 9.1 K/uL (ref 4.0–10.5)
nRBC: 0 % (ref 0.0–0.2)

## 2024-02-25 MED ORDER — SODIUM CHLORIDE 0.9 % IV SOLN
5.0000 10*6.[IU] | Freq: Once | INTRAVENOUS | Status: AC
  Administered 2024-02-26: 5 10*6.[IU] via INTRAVENOUS
  Filled 2024-02-25: qty 5

## 2024-02-25 MED ORDER — LABETALOL HCL 5 MG/ML IV SOLN
20.0000 mg | INTRAVENOUS | Status: DC | PRN
  Administered 2024-02-25: 20 mg via INTRAVENOUS

## 2024-02-25 MED ORDER — LACTATED RINGERS IV SOLN: INTRAVENOUS | Status: AC

## 2024-02-25 MED ORDER — NIFEDIPINE 10 MG PO CAPS: 20.0000 mg | ORAL_CAPSULE | ORAL | Status: DC | PRN

## 2024-02-25 MED ORDER — NIFEDIPINE 10 MG PO CAPS: 10.0000 mg | ORAL_CAPSULE | ORAL | Status: DC | PRN

## 2024-02-25 MED ORDER — LABETALOL HCL 5 MG/ML IV SOLN: 40.0000 mg | INTRAVENOUS | Status: DC | PRN

## 2024-02-25 MED ORDER — OXYTOCIN-SODIUM CHLORIDE 30-0.9 UT/500ML-% IV SOLN
1.0000 m[IU]/min | INTRAVENOUS | Status: DC
  Administered 2024-02-26: 2 m[IU]/min via INTRAVENOUS
  Filled 2024-02-25 (×2): qty 500

## 2024-02-25 MED ORDER — ONDANSETRON HCL 4 MG/2ML IJ SOLN
4.0000 mg | Freq: Four times a day (QID) | INTRAMUSCULAR | Status: DC | PRN
  Administered 2024-02-26 (×3): 4 mg via INTRAVENOUS
  Filled 2024-02-25 (×4): qty 2

## 2024-02-25 MED ORDER — FAMOTIDINE 20 MG PO TABS
20.0000 mg | ORAL_TABLET | Freq: Every day | ORAL | Status: DC
  Administered 2024-02-26: 20 mg via ORAL
  Filled 2024-02-25: qty 1

## 2024-02-25 MED ORDER — LIDOCAINE HCL (PF) 1 % IJ SOLN
30.0000 mL | INTRAMUSCULAR | Status: AC | PRN
  Administered 2024-02-27: 30 mL via SUBCUTANEOUS
  Filled 2024-02-25: qty 30

## 2024-02-25 MED ORDER — TERBUTALINE SULFATE 1 MG/ML IJ SOLN: 0.2500 mg | Freq: Once | INTRAMUSCULAR | Status: DC | PRN

## 2024-02-25 MED ORDER — LACTATED RINGERS IV SOLN
500.0000 mL | INTRAVENOUS | Status: AC | PRN
  Administered 2024-02-26 (×2): 500 mL via INTRAVENOUS

## 2024-02-25 MED ORDER — MAGNESIUM SULFATE BOLUS VIA INFUSION
4.0000 g | Freq: Once | INTRAVENOUS | Status: AC
  Administered 2024-02-26: 4 g via INTRAVENOUS
  Filled 2024-02-25: qty 1000

## 2024-02-25 MED ORDER — OXYTOCIN-SODIUM CHLORIDE 30-0.9 UT/500ML-% IV SOLN
2.5000 [IU]/h | INTRAVENOUS | Status: DC
Start: 2024-02-25 — End: 2024-02-27
  Administered 2024-02-27: 2.5 [IU]/h via INTRAVENOUS

## 2024-02-25 MED ORDER — PENICILLIN G POT IN DEXTROSE 60000 UNIT/ML IV SOLN
3.0000 10*6.[IU] | INTRAVENOUS | Status: DC
  Administered 2024-02-26 (×5): 3 10*6.[IU] via INTRAVENOUS
  Filled 2024-02-25 (×8): qty 50

## 2024-02-25 MED ORDER — LABETALOL HCL 5 MG/ML IV SOLN
40.0000 mg | INTRAVENOUS | Status: DC | PRN
  Filled 2024-02-25: qty 8

## 2024-02-25 MED ORDER — SOD CITRATE-CITRIC ACID 500-334 MG/5ML PO SOLN: 30.0000 mL | ORAL | Status: DC | PRN

## 2024-02-25 MED ORDER — OXYTOCIN BOLUS FROM INFUSION
333.0000 mL | Freq: Once | INTRAVENOUS | Status: AC
  Administered 2024-02-27: 333 mL via INTRAVENOUS

## 2024-02-25 MED ORDER — MISOPROSTOL 50MCG HALF TABLET: 50.0000 ug | ORAL_TABLET | ORAL | Status: DC | PRN

## 2024-02-25 MED ORDER — OXYCODONE-ACETAMINOPHEN 5-325 MG PO TABS: 1.0000 | ORAL_TABLET | ORAL | Status: DC | PRN

## 2024-02-25 MED ORDER — LABETALOL HCL 5 MG/ML IV SOLN: 80.0000 mg | INTRAVENOUS | Status: DC | PRN

## 2024-02-25 MED ORDER — HYDRALAZINE HCL 20 MG/ML IJ SOLN: 10.0000 mg | INTRAMUSCULAR | Status: DC | PRN

## 2024-02-25 MED ORDER — MAGNESIUM SULFATE 40 GM/1000ML IV SOLN
1.0000 g/h | INTRAVENOUS | Status: DC
  Administered 2024-02-26: 2 g/h via INTRAVENOUS
  Filled 2024-02-25 (×2): qty 1000

## 2024-02-25 MED ORDER — ACETAMINOPHEN 325 MG PO TABS
650.0000 mg | ORAL_TABLET | ORAL | Status: DC | PRN
  Administered 2024-02-26: 650 mg via ORAL
  Filled 2024-02-25: qty 2

## 2024-02-25 MED ORDER — OXYCODONE-ACETAMINOPHEN 5-325 MG PO TABS: 2.0000 | ORAL_TABLET | ORAL | Status: DC | PRN

## 2024-02-25 NOTE — H&P (Signed)
 Fedora Knisely is a 28 y.o. G45P0101 female at [redacted]w[redacted]d presenting for elevated blood pressures at home in the setting of pre-eclampsia without severe features. She was scheduled for medical IOL today, but has not yet been called in due to L&D unit patient census. She denies headache, vision changes, chest pain, shortness of breath, and RUQ pain. She reports good fetal movement and denies frank vaginal bleeding and LOF. She has had back contractions around every 8 minutes since 9pm.  She was initially diagnosed with pre-E without severe features at the MAU on 8/22 by BPs and proteinuria (0.33). She received BMZ on 8/25-/8/26. Since that time she has had twice weekly testing and weekly labs. Most recent growth US  on 8/27 showed EFW 41% (5lb2oz = 2313g), AC 58%.   Her pregnancy is otherwise complicated by: -gestational thrombocytopenia: most recent platelets outpatient stable at 144 -anemia of pregnancy: started PO iron  q other day @ 28wk. s/p iron  infusion on 08/22 and 08/29. Most recent Hgb 11.0 on 9/17. -Rh negative: received Rhogam at 28w. -GBS positive: by urine culture. No PCN allergy.  OB History     Gravida  2   Para  1   Term      Preterm  1   AB      Living  1      SAB      IAB      Ectopic      Multiple  0   Live Births  1          Past Medical History:  Diagnosis Date   Abnormal blood creatinine level    Anxiety    Family history of adverse reaction to anesthesia    Family history of BRCA1 gene positive    Family history of breast cancer    Family history of ovarian cancer    Medical history non-contributory    Preeclampsia    Severe preeclampsia    Past Surgical History:  Procedure Laterality Date   BREAST BIOPSY Right 11/06/2021   BREAST BIOPSY Right 01/27/2022   BREAST BIOPSY  06/11/2022   MM RT RADIOACTIVE SEED LOC MAMMO GUIDE 06/11/2022 [facility]   RADIOACTIVE SEED GUIDED EXCISIONAL BREAST BIOPSY Right 06/12/2022   Procedure: RADIOACTIVE SEED  GUIDED EXCISIONAL RIGHT BREAST BIOPSY;  Surgeon: Ebbie Cough, MD;  Location: [facility];  Service: General;  Laterality: Right;   WISDOM TOOTH EXTRACTION     04/2022   Family History: family history includes Breast cancer (age of onset: 34) in her mother; Ovarian cancer in her maternal great-grandmother and another family member. Social History:  reports that she has never smoked. She has never used smokeless tobacco. She reports that she does not currently use alcohol. She reports that she does not use drugs.     Maternal Diabetes: No Genetic Screening: Normal Maternal Ultrasounds/Referrals: Normal Fetal Ultrasounds or other Referrals:  None Maternal Substance Abuse:  No Significant Maternal Medications:  Meds include: Other: Aspirin Significant Maternal Lab Results:  Group B Strep positive and Rh negative Number of Prenatal Visits:greater than 3 verified prenatal visits Maternal Vaccinations:TDap   Review of Systems  All other systems reviewed and are negative.  Maternal Medical History:  Reason for admission: Contractions.   Contractions: Onset was 1-2 hours ago.   Frequency: regular.   Perceived severity is mild.   Fetal activity: Perceived fetal activity is normal.   Last perceived fetal movement was within the past hour.   Prenatal complications: PIH, pre-eclampsia and  thrombocytopenia.   No bleeding, cholelithiasis, HIV, infection, IUGR, nephrolithiasis, oligohydramnios, placental abnormality, polyhydramnios, preterm labor, substance abuse or thrombophilia.   Prenatal Complications - Diabetes: none.   Dilation: 3 Exam by:: Machell Wirthlin, MD Blood pressure (!) 138/96, pulse 73, temperature 98.5 F (36.9 C), temperature source Oral, resp. rate 16, height 5' 1 (1.549 m), weight 59 kg, SpO2 98%, unknown if currently breastfeeding. Maternal Exam:  Abdomen: Patient reports no abdominal tenderness. Estimated fetal weight is 6.5lbs.   Introitus: Normal  vulva. Normal vagina.    Fetal Exam Fetal Monitor Review: Baseline rate: 120.  Variability: moderate (6-25 bpm).   Pattern: accelerations present and no decelerations.   Fetal State Assessment: Category I - tracings are normal.   Physical Exam Vitals reviewed.  Constitutional:      Appearance: Normal appearance.  HENT:     Head: Normocephalic.     Nose: Nose normal.  Eyes:     Extraocular Movements: Extraocular movements intact.  Cardiovascular:     Comments: Well perfused Pulmonary:     Effort: Pulmonary effort is normal.  Abdominal:     Comments: Gravid, non-tender  Genitourinary:    General: Normal vulva.  Musculoskeletal:        General: Normal range of motion.     Cervical back: Normal range of motion.     Right lower leg: No edema.     Left lower leg: No edema.  Skin:    General: Skin is warm and dry.  Neurological:     General: No focal deficit present.     Mental Status: She is alert and oriented to person, place, and time.  Psychiatric:        Mood and Affect: Mood normal.        Behavior: Behavior normal.        Thought Content: Thought content normal.        Judgment: Judgment normal.     Prenatal labs: ABO, Rh: --/--/O NEG (09/18 2252) Antibody: POS (09/18 2252) Rubella: Immune (03/07 0000) RPR: Nonreactive (03/07 0000)  HBsAg: Negative (03/07 0000)  HIV: Non-reactive (03/07 0000)  GBS: Positive/-- (03/07 0000)   Assessment/Plan: Lexii Walsh is a 28 y.o. G53P0101 female at [redacted]w[redacted]d now ruling in for pre-eclampsia with severe features by treatable blood pressures and undergoing IOL. -IOL: SVE 3/50/-2 on admission. Plan pitocin  per protocol. Epidural when patient desires. Patient desires up to 3 minutes of delayed cord clamping if possible at delivery. -pre-eclampsia with severe features: by treatable severe range blood pressures on presentation. Received 20mg  IV labetalol  with good response. Will give 4g IV mag bolus followed by 2g IV mag infusion  through 24 hours postpartum. Currently asymptomatic. LFTs and Cr wnl, platelets as below. Patient reports good response to nifedipine  after her prior delivery; will start 30mg  nifedipine  daily now. -gestational thrombocytopenia: most recent platelets outpatient 144; stable at 146 on presentation. -anemia of pregnancy: started PO iron  q other day @ 28wk. s/p iron  infusion on 08/22 and 08/29. Hgb 11.1 on admission. -Rh negative: received Rhogam at 28w. Postpartum Rhogam pending baby Rh -GBS positive: by urine culture. No PCN allergy. PCN ordered.   Dispo: care for pre-eclampsia with SF as above. Anticipate SVD.  Vinie Charity A Sreya Froio 02/26/2024, 12:12 AM

## 2024-02-25 NOTE — MAU Note (Signed)
.  Joan Sanchez is a 28 y.o. at [redacted]w[redacted]d here in MAU reporting: High BP without physical pre-e symptoms that pt took at home at 2105 tonight. The BP was 159/95. Reports BH, +FM. Denies VB and LOF.  LMP: na Onset of complaint: 2105 Pain score: 0/10 Vitals:   02/25/24 2233  SpO2: 100%     FHT: 129  Lab orders placed from triage: na

## 2024-02-26 ENCOUNTER — Inpatient Hospital Stay (HOSPITAL_COMMUNITY): Admitting: Anesthesiology

## 2024-02-26 LAB — COMPREHENSIVE METABOLIC PANEL WITH GFR
ALT: 14 U/L (ref 0–44)
ALT: 13 U/L (ref 0–44)
AST: 25 U/L (ref 15–41)
AST: 25 U/L (ref 15–41)
Albumin: 3.2 g/dL — ABNORMAL LOW (ref 3.5–5.0)
Albumin: 3.1 g/dL — ABNORMAL LOW (ref 3.5–5.0)
Alkaline Phosphatase: 148 U/L — ABNORMAL HIGH (ref 38–126)
Alkaline Phosphatase: 146 U/L — ABNORMAL HIGH (ref 38–126)
Anion gap: 12 (ref 5–15)
Anion gap: 11 (ref 5–15)
BUN: 5 mg/dL — ABNORMAL LOW (ref 6–20)
BUN: <5 mg/dL — ABNORMAL LOW (ref 6–20)
CO2: 17 mmol/L — ABNORMAL LOW (ref 22–32)
CO2: 21 mmol/L — ABNORMAL LOW (ref 22–32)
Calcium: 9.4 mg/dL (ref 8.9–10.3)
Calcium: 7.2 mg/dL — ABNORMAL LOW (ref 8.9–10.3)
Chloride: 106 mmol/L (ref 98–111)
Chloride: 98 mmol/L (ref 98–111)
Creatinine, Ser: 0.63 mg/dL (ref 0.44–1.00)
Creatinine, Ser: 0.65 mg/dL (ref 0.44–1.00)
GFR, Estimated: >60 mL/min (ref >60)
GFR, Estimated: >60 mL/min (ref >60)
Glucose, Bld: 83 mg/dL (ref 70–99)
Glucose, Bld: 94 mg/dL (ref 70–99)
Potassium: 3.8 mmol/L (ref 3.5–5.1)
Potassium: 3.7 mmol/L (ref 3.5–5.1)
Sodium: 135 mmol/L (ref 135–145)
Sodium: 130 mmol/L — ABNORMAL LOW (ref 135–145)
Total Bilirubin: 0.7 mg/dL (ref 0.0–1.2)
Total Bilirubin: 0.7 mg/dL (ref 0.0–1.2)
Total Protein: 6.6 g/dL (ref 6.5–8.1)
Total Protein: 6.4 g/dL — ABNORMAL LOW (ref 6.5–8.1)

## 2024-02-26 LAB — CBC WITH DIFFERENTIAL/PLATELET
Abs Immature Granulocytes: 0.08 K/uL — ABNORMAL HIGH (ref 0.00–0.07)
Basophils Absolute: 0 K/uL (ref 0.0–0.1)
Basophils Relative: 0 %
Eosinophils Absolute: 0 K/uL (ref 0.0–0.5)
Eosinophils Relative: 0 %
HCT: 32.8 % — ABNORMAL LOW (ref 36.0–46.0)
Hemoglobin: 11.4 g/dL — ABNORMAL LOW (ref 12.0–15.0)
Immature Granulocytes: 1 %
Lymphocytes Relative: 8 %
Lymphs Abs: 1 K/uL (ref 0.7–4.0)
MCH: 31.1 pg (ref 26.0–34.0)
MCHC: 34.8 g/dL (ref 30.0–36.0)
MCV: 89.6 fL (ref 80.0–100.0)
Monocytes Absolute: 0.6 K/uL (ref 0.1–1.0)
Monocytes Relative: 5 %
Neutro Abs: 10.3 K/uL — ABNORMAL HIGH (ref 1.7–7.7)
Neutrophils Relative %: 86 %
Platelets: 159 K/uL (ref 150–400)
RBC: 3.66 MIL/uL — ABNORMAL LOW (ref 3.87–5.11)
RDW: 14.1 % (ref 11.5–15.5)
WBC: 12 K/uL — ABNORMAL HIGH (ref 4.0–10.5)
nRBC: 0 % (ref 0.0–0.2)

## 2024-02-26 LAB — TYPE AND SCREEN
ABO/RH(D): O NEG
Antibody Screen: POSITIVE

## 2024-02-26 LAB — MAGNESIUM: Magnesium: 6.4 mg/dL (ref 1.7–2.4)

## 2024-02-26 LAB — RPR: RPR Ser Ql: NONREACTIVE

## 2024-02-26 MED ORDER — BUPIVACAINE HCL (PF) 0.25 % IJ SOLN
INTRAMUSCULAR | Status: DC | PRN
  Administered 2024-02-26: 5 mL via EPIDURAL
  Administered 2024-02-26 (×2): 8 mL via EPIDURAL
  Administered 2024-02-26: 5 mL via EPIDURAL

## 2024-02-26 MED ORDER — LACTATED RINGERS IV SOLN
500.0000 mL | Freq: Once | INTRAVENOUS | Status: AC
  Administered 2024-02-26: 500 mL via INTRAVENOUS

## 2024-02-26 MED ORDER — PHENYLEPHRINE 80 MCG/ML (10ML) SYRINGE FOR IV PUSH (FOR BLOOD PRESSURE SUPPORT)
80.0000 ug | PREFILLED_SYRINGE | INTRAVENOUS | Status: DC | PRN
  Administered 2024-02-26: 80 ug via INTRAVENOUS
  Filled 2024-02-26: qty 10

## 2024-02-26 MED ORDER — FENTANYL CITRATE (PF) 100 MCG/2ML IJ SOLN: INTRAMUSCULAR | Status: DC | PRN

## 2024-02-26 MED ORDER — EPHEDRINE 5 MG/ML INJ
10.0000 mg | INTRAVENOUS | Status: AC | PRN
  Administered 2024-02-26 (×2): 10 mg via INTRAVENOUS
  Filled 2024-02-26 (×2): qty 5

## 2024-02-26 MED ORDER — NIFEDIPINE ER OSMOTIC RELEASE 30 MG PO TB24
30.0000 mg | ORAL_TABLET | Freq: Every day | ORAL | Status: DC
  Administered 2024-02-26: 30 mg via ORAL
  Filled 2024-02-26: qty 1

## 2024-02-26 MED ORDER — ONDANSETRON HCL 4 MG/2ML IJ SOLN
4.0000 mg | Freq: Once | INTRAMUSCULAR | Status: AC
  Administered 2024-02-26: 4 mg via INTRAVENOUS

## 2024-02-26 MED ORDER — FENTANYL CITRATE (PF) 100 MCG/2ML IJ SOLN
INTRAMUSCULAR | Status: AC
  Filled 2024-02-26: qty 2

## 2024-02-26 MED ORDER — PANTOPRAZOLE SODIUM 40 MG IV SOLR
40.0000 mg | Freq: Once | INTRAVENOUS | Status: AC
  Administered 2024-02-26: 40 mg via INTRAVENOUS
  Filled 2024-02-26: qty 10

## 2024-02-26 MED ORDER — PHENYLEPHRINE 80 MCG/ML (10ML) SYRINGE FOR IV PUSH (FOR BLOOD PRESSURE SUPPORT)
80.0000 ug | PREFILLED_SYRINGE | INTRAVENOUS | Status: AC | PRN
  Administered 2024-02-26 (×2): 160 ug via INTRAVENOUS
  Filled 2024-02-26: qty 10

## 2024-02-26 MED ORDER — FENTANYL CITRATE (PF) 100 MCG/2ML IJ SOLN
INTRAMUSCULAR | Status: DC | PRN
  Administered 2024-02-26 (×3): 100 ug via EPIDURAL

## 2024-02-26 MED ORDER — DIPHENHYDRAMINE HCL 50 MG/ML IJ SOLN: 12.5000 mg | INTRAMUSCULAR | Status: DC | PRN

## 2024-02-26 MED ORDER — EPHEDRINE 5 MG/ML INJ
10.0000 mg | INTRAVENOUS | Status: AC | PRN
  Administered 2024-02-26 (×2): 10 mg via INTRAVENOUS

## 2024-02-26 MED ORDER — FENTANYL-BUPIVACAINE-NACL 0.5-0.125-0.9 MG/250ML-% EP SOLN
12.0000 mL/h | EPIDURAL | Status: DC | PRN
  Administered 2024-02-26: 12 mL/h via EPIDURAL
  Filled 2024-02-26 (×2): qty 250

## 2024-02-26 MED ORDER — MAGNESIUM SULFATE IN D5W 1-5 GM/100ML-% IV SOLN: 1.0000 g | INTRAVENOUS | Status: DC

## 2024-02-26 MED ORDER — LIDOCAINE HCL (PF) 1 % IJ SOLN
INTRAMUSCULAR | Status: DC | PRN
  Administered 2024-02-26: 5 mL via EPIDURAL

## 2024-02-26 MED ORDER — SODIUM CHLORIDE 0.9 % IV SOLN: INTRAVENOUS | Status: AC

## 2024-02-26 NOTE — Anesthesia Preprocedure Evaluation (Signed)
 Anesthesia Evaluation  Patient identified by MRN, date of birth, ID band Patient awake    Reviewed: Allergy & Precautions, NPO status , Patient's Chart, lab work & pertinent test results  Airway Mallampati: II  TM Distance: >3 FB Neck ROM: Full    Dental no notable dental hx. (+) Teeth Intact, Dental Advisory Given   Pulmonary    Pulmonary exam normal breath sounds clear to auscultation       Cardiovascular hypertension (prE), Normal cardiovascular exam Rhythm:Regular Rate:Normal     Neuro/Psych    GI/Hepatic Neg liver ROS,,,  Endo/Other  negative endocrine ROS    Renal/GU negative Renal ROS     Musculoskeletal   Abdominal   Peds  Hematology  (+) Blood dyscrasia Lab Results      Component                Value               Date                      WBC                      9.1                 02/25/2024                HGB                      11.1 (L)            02/25/2024                HCT                      31.8 (L)            02/25/2024                MCV                      88.1                02/25/2024                PLT                      146 (L)             02/25/2024              Anesthesia Other Findings   Reproductive/Obstetrics                              Anesthesia Physical Anesthesia Plan  ASA: 3  Anesthesia Plan: Epidural   Post-op Pain Management:    Induction:   PONV Risk Score and Plan:   Airway Management Planned:   Additional Equipment:   Intra-op Plan:   Post-operative Plan:   Informed Consent: I have reviewed the patients History and Physical, chart, labs and discussed the procedure including the risks, benefits and alternatives for the proposed anesthesia with the patient or authorized representative who has indicated his/her understanding and acceptance.       Plan Discussed with:   Anesthesia Plan Comments: (37.3 wk G2P1 w prE  and  gestational thrombocytopenia for LEA)  Anesthesia Quick Evaluation

## 2024-02-26 NOTE — H&P (Incomplete)
 Rynn Markiewicz is a 28 y.o. G61P0101 female at [redacted]w[redacted]d presenting for elevated blood pressures at home in the setting of pre-eclampsia without severe features. She was scheduled for medical IOL today, but has not yet been called in due to L&D unit patient census. She denies headache, vision changes, chest pain, shortness of breath, and RUQ pain. She reports good fetal movement and denies frank vaginal bleeding and LOF. She has had back contractions around every 8 minutes since 9pm.  She was initially diagnosed with pre-E without severe features at the MAU on 8/22 by BPs and proteinuria (0.33). She received BMZ on 8/25-/8/26. Since that time she has had twice weekly testing and weekly labs. Most recent growth US  on 8/27 showed EFW 41% (5lb2oz = 2313g), AC 58%.   Her pregnancy is otherwise complicated by: -gestational thrombocytopenia: most recent platelets outpatient stable at 144 -anemia of pregnancy: started PO iron  q other day @ 28wk. s/p iron  infusion on 08/22 and 08/29. Most recent Hgb 11.0 on 9/17. -Rh negative: received Rhogam at 28w. -GBS positive: by urine culture. No PCN allergy.  OB History     Gravida  2   Para  1   Term      Preterm  1   AB      Living  1      SAB      IAB      Ectopic      Multiple  0   Live Births  1          Past Medical History:  Diagnosis Date  . Abnormal blood creatinine level   . Anxiety   . Family history of adverse reaction to anesthesia   . Family history of BRCA1 gene positive   . Family history of breast cancer   . Family history of ovarian cancer   . Medical history non-contributory   . Preeclampsia   . Severe preeclampsia    Past Surgical History:  Procedure Laterality Date  . BREAST BIOPSY Right 11/06/2021  . BREAST BIOPSY Right 01/27/2022  . BREAST BIOPSY  06/11/2022   MM RT RADIOACTIVE SEED LOC MAMMO GUIDE 06/11/2022 [facility]  . RADIOACTIVE SEED GUIDED EXCISIONAL BREAST BIOPSY Right 06/12/2022   Procedure:  RADIOACTIVE SEED GUIDED EXCISIONAL RIGHT BREAST BIOPSY;  Surgeon: Ebbie Cough, MD;  Location: [facility];  Service: General;  Laterality: Right;  . WISDOM TOOTH EXTRACTION     04/2022   Family History: family history includes Breast cancer (age of onset: 94) in her mother; Ovarian cancer in her maternal great-grandmother and another family member. Social History:  reports that she has never smoked. She has never used smokeless tobacco. She reports that she does not currently use alcohol. She reports that she does not use drugs.     Maternal Diabetes: No Genetic Screening: Normal Maternal Ultrasounds/Referrals: Normal Fetal Ultrasounds or other Referrals:  None Maternal Substance Abuse:  No Significant Maternal Medications:  Meds include: Other: Aspirin Significant Maternal Lab Results:  Group B Strep positive and Rh negative Number of Prenatal Visits:greater than 3 verified prenatal visits Maternal Vaccinations:TDap   Review of Systems  All other systems reviewed and are negative.  Maternal Medical History:  Reason for admission: Contractions.   Contractions: Onset was 1-2 hours ago.   Frequency: regular.   Perceived severity is mild.   Fetal activity: Perceived fetal activity is normal.   Last perceived fetal movement was within the past hour.   Prenatal complications: PIH, pre-eclampsia and  thrombocytopenia.   No bleeding, cholelithiasis, HIV, infection, IUGR, nephrolithiasis, oligohydramnios, placental abnormality, polyhydramnios, preterm labor, substance abuse or thrombophilia.   Prenatal Complications - Diabetes: none.     Blood pressure (!) 138/96, pulse 73, height 5' 1 (1.549 m), weight 59 kg, SpO2 98%, unknown if currently breastfeeding. Maternal Exam:  Abdomen: Patient reports no abdominal tenderness. Estimated fetal weight is 6.5lbs.     Fetal Exam Fetal Monitor Review: Baseline rate: 120.  Variability: moderate (6-25 bpm).   Pattern:  accelerations present and no decelerations.   Fetal State Assessment: Category I - tracings are normal.   Physical Exam Vitals reviewed.  Constitutional:      Appearance: Normal appearance.  HENT:     Head: Normocephalic.     Nose: Nose normal.  Eyes:     Extraocular Movements: Extraocular movements intact.  Cardiovascular:     Comments: Well perfused Pulmonary:     Effort: Pulmonary effort is normal.  Abdominal:     Comments: Gravid, non-tender  Musculoskeletal:        General: Normal range of motion.     Cervical back: Normal range of motion.     Right lower leg: No edema.     Left lower leg: No edema.  Skin:    General: Skin is warm and dry.  Neurological:     General: No focal deficit present.     Mental Status: She is alert and oriented to person, place, and time.  Psychiatric:        Mood and Affect: Mood normal.        Behavior: Behavior normal.        Thought Content: Thought content normal.        Judgment: Judgment normal.     Prenatal labs: ABO, Rh: --/--/PENDING (09/18 2252) Antibody: PENDING (09/18 2252) Rubella: Immune (03/07 0000) RPR: Nonreactive (03/07 0000)  HBsAg: Negative (03/07 0000)  HIV: Non-reactive (03/07 0000)  GBS: Positive/-- (03/07 0000)   Assessment/Plan: Zakyla Tonche is a 28 y.o. G63P0101 female at [redacted]w[redacted]d now ruling in for pre-eclampsia with severe features by treatable blood pressures and undergoing IOL. -IOL: Epidural when patient desires -pre-eclampsia with severe features: by treatable severe range blood pressures on presentation. Received 20mg  IV labetalol  with good response. Will give 4g IV mag bolus followed by 2g IV mag infusion through 24 hours postpartum. Currently asymptomatic. Labs pending. -gestational thrombocytopenia: most recent platelets outpatient 144; stable at 146 on presentation. -anemia of pregnancy: started PO iron  q other day @ 28wk. s/p iron  infusion on 08/22 and 08/29. Hgb 11.1 on admission. -Rh negative:  received Rhogam at 28w. -GBS positive: by urine culture. No PCN allergy. PCN ordered.   Kayelynn Abdou A Mikah Poss 02/25/2024, 11:41 PM

## 2024-02-26 NOTE — Anesthesia Procedure Notes (Signed)
 Epidural Patient location during procedure: OB Start time: 02/26/2024 1:29 AM End time: 02/26/2024 1:41 AM  Staffing Anesthesiologist: Jefm Garnette LABOR, MD Performed: anesthesiologist   Preanesthetic Checklist Completed: patient identified, IV checked, site marked, risks and benefits discussed, surgical consent, monitors and equipment checked, pre-op evaluation and timeout performed  Epidural Patient position: sitting Prep: DuraPrep and site prepped and draped Patient monitoring: continuous pulse ox and blood pressure Approach: midline Location: L3-L4 Injection technique: LOR air  Needle:  Needle type: Tuohy  Needle gauge: 17 G Needle length: 9 cm and 9 Needle insertion depth: 4 cm Catheter type: closed end flexible Catheter size: 19 Gauge Catheter at skin depth: 9 cm Test dose: negative  Assessment Events: blood not aspirated, no cerebrospinal fluid, injection not painful, no injection resistance, no paresthesia and negative IV test  Additional Notes Patient identified. Risks/Benefits/Options discussed with patient including but not limited to bleeding, infection, nerve damage, paralysis, failed block, incomplete pain control, headache, blood pressure changes, nausea, vomiting, reactions to medication both or allergic, itching and postpartum back pain. Confirmed with bedside nurse the patient's most recent platelet count. Confirmed with patient that they are not currently taking any anticoagulation, have any bleeding history or any family history of bleeding disorders. Patient expressed understanding and wished to proceed. All questions were answered. Sterile technique was used throughout the entire procedure. Please see nursing notes for vital signs. Test dose was given through epidural needle and negative prior to continuing to dose epidural or start infusion. Warning signs of high block given to the patient including shortness of breath, tingling/numbness in hands, complete motor  block, or any concerning symptoms with instructions to call for help. Patient was given instructions on fall risk and not to get out of bed. All questions and concerns addressed with instructions to call with any issues.  1  (S) . Patient tolerated procedure well.

## 2024-02-26 NOTE — Progress Notes (Signed)
 OB Progress Note  S: Has dealt with intermittent nausea/dizziness throughout the afternoon. Labs checked and normal except mild hyponatremia (at which time LR was switched to NS). Magnesium  in therapeutic range. Suspect symptomatic hypotension as blood pressures were running low and she responded symptomatically to ephedrine .   She has been feeling an increase in rectal pressure. Exam is 9.5/100/0 with anterior lip    O: Today's Vitals   02/26/24 1832 02/26/24 1901 02/26/24 1930 02/26/24 1935  BP: 130/77 131/84  123/76  Pulse: 91 76  88  Resp:  18    Temp:      TempSrc:      SpO2:      Weight:      Height:      PainSc:   10-Worst pain ever    Body mass index is 24.56 kg/m.  Foley with blood tinged urine  FHR: 115bpm, mod variability, + accels, no decels Toco: ctx q 2- 3 mins    A/P: 27Y G2P0101 @ 37.3, IOL severe preE Fetal wellbeing: cat I tracing IOL: s/p AROM, continue pitocin , anticipate SVD Pain control: epidural Severe preeclampsia: on magnesium , normotensive currently. Continue to monitor for signs of magnesium  toxicity or worsening preeclampsia GBS positive: continue penicillin   EMERSON Chapel MD 02/26/24 7:43 PM

## 2024-02-26 NOTE — Progress Notes (Signed)
 Joan Sanchez is a 28 y.o. G2P0101 at [redacted]w[redacted]d Subjective: Reports nausea and mild lightheadedness. Denies pre-E symptoms. Comfortable with epidural  Objective: BP 102/61   Pulse 69   Temp 98.4 F (36.9 C) (Oral)   Resp 17   Ht 5' 1 (1.549 m)   Wt 59 kg   SpO2 97%   BMI 24.56 kg/m  No intake/output data recorded. Total I/O In: 1982.9 [P.O.:850; I.V.:882.9; IV Piggyback:250] Out: 1500 [Urine:1500]  FHT:  FHR: 115 bpm, variability: moderate,  accelerations:  Present,  decelerations:  Absent UC:   regular, every 7-9 minutes SVE:   Dilation: 3 Effacement (%): 80 Station: -2 Exam by:: Gizell Danser, MD  Labs: Lab Results  Component Value Date   WBC 9.1 02/25/2024   HGB 11.1 (L) 02/25/2024   HCT 31.8 (L) 02/25/2024   MCV 88.1 02/25/2024   PLT 146 (L) 02/25/2024    Assessment / Plan: Joan Sanchez is a 28 y.o. G50P0101 female at [redacted]w[redacted]d now ruling in for pre-eclampsia with severe features by treatable blood pressures and undergoing IOL. -IOL: Minimal cervical change since admission; AROM performed with clear fluid. Pitocin  at 10; continue to uptitrate pitocin  per protocol. Comfortable with epidural. Patient desires up to 3 minutes of delayed cord clamping if possible at delivery. -pre-eclampsia with severe features: by treatable severe range blood pressures on presentation. Received 20mg  IV labetalol  with good response. Now on 2g/hr IV mag infusion through 24 hours postpartum. Currently asymptomatic. LFTs and Cr wnl, platelets as below. Started nifedipine  30mg  daily as long acting BP med shortly after admission with good response. -gestational thrombocytopenia: most recent platelets outpatient 144; stable at 146 on presentation. -h/o anemia of pregnancy: started PO iron  q other day @ 28wk. s/p iron  infusion on 08/22 and 08/29. Hgb 11.1 on admission. -Rh negative: received Rhogam at 28w. Postpartum Rhogam pending baby Rh -GBS positive: by urine culture. No PCN allergy. PCN ordered.     Dispo: care for pre-eclampsia with SF as above. Anticipate SVD.  Joan DELENA Husky, MD 02/26/2024, 5:36 AM

## 2024-02-26 NOTE — Progress Notes (Signed)
 OB Progress Note  Feeling a lot of rectal pain/pressure   O: Today's Vitals   02/26/24 2115 02/26/24 2120 02/26/24 2121 02/26/24 2125  BP:      Pulse:      Resp:      Temp:      TempSrc:      SpO2: 100% 100% 100% 99%  Weight:      Height:      PainSc:       Body mass index is 24.56 kg/m.  SVE 9.5/100/0, anterior lip reduces with pushing and then returns between contractions  FHR: 120bpm, mod variability, + accels, occ variable decels Toco: ctx q 3 mins   A/P:  27Y G2P0101 @ 37.3, IOL severe preE Fetal wellbeing: cat I tracing IOL: s/p AROM, continue pitocin . Attempted pushing, patient has good effort, anterior lip present between pushes. Would like patient to be comfortable enough to labor down and allow for anterior lip to reduce spontaneously. Requested epidural bolus from OB Anesthesiologist. Suspect OP presentation.  Pain control: epidural Severe preeclampsia: on magnesium , normotensive currently. Continue to monitor for signs of magnesium  toxicity or worsening preeclampsia GBS positive: continue penicillin    EMERSON Chapel MD 02/26/24 9:33 PM

## 2024-02-26 NOTE — Progress Notes (Signed)
 Patient requests to be a security patient 02/26/24 @ 0200; Patient registration notified; Support person is [employer]

## 2024-02-26 NOTE — Progress Notes (Signed)
 OB Progress Note  S: feeling nauseas, some pelvic/rectal pressure   O: Today's Vitals   02/26/24 0830 02/26/24 0840 02/26/24 0845 02/26/24 0900  BP: 103/63   103/65  Pulse: 66   67  Resp:  16    Temp:      TempSrc:      SpO2:  100% 100%   Weight:      Height:      PainSc:       Body mass index is 24.56 kg/m.  SVE 4/80/-2, OT, + scalp stim  FHR: 120bpm, mod variability, + accels, no decels Toco: ctx q 2-3 mins   A/P: 27Y G2P0101 @ 37.3, IOL severe preE Fetal wellbeing: cat I tracing IOL: s/p AROM, continue pitocin  (currently at 14 mu/min), position changes PRN Pain control: epidural Severe preeclampsia: on magnesium , normotensive currently. Continue to monitor for signs of magnesium  toxicity or worsening preeclampsia. Had Procardia  XL 30mg  daily ordered.  GBS positive: continue penicllin M. Samik Balkcom MD 02/26/24 9:37 AM

## 2024-02-27 ENCOUNTER — Encounter (HOSPITAL_COMMUNITY): Payer: Self-pay | Admitting: Obstetrics and Gynecology

## 2024-02-27 LAB — COMPREHENSIVE METABOLIC PANEL WITH GFR
ALT: 11 U/L (ref 0–44)
AST: 30 U/L (ref 15–41)
Albumin: 2.3 g/dL — ABNORMAL LOW (ref 3.5–5.0)
Alkaline Phosphatase: 123 U/L (ref 38–126)
Anion gap: 12 (ref 5–15)
BUN: 5 mg/dL — ABNORMAL LOW (ref 6–20)
CO2: 16 mmol/L — ABNORMAL LOW (ref 22–32)
Calcium: 6.5 mg/dL — ABNORMAL LOW (ref 8.9–10.3)
Chloride: 97 mmol/L — ABNORMAL LOW (ref 98–111)
Creatinine, Ser: 0.91 mg/dL (ref 0.44–1.00)
GFR, Estimated: >60 mL/min (ref >60)
Glucose, Bld: 114 mg/dL — ABNORMAL HIGH (ref 70–99)
Potassium: 3.9 mmol/L (ref 3.5–5.1)
Sodium: 125 mmol/L — ABNORMAL LOW (ref 135–145)
Total Bilirubin: 0.9 mg/dL (ref 0.0–1.2)
Total Protein: 5.1 g/dL — ABNORMAL LOW (ref 6.5–8.1)

## 2024-02-27 LAB — CBC
HCT: 27.5 % — ABNORMAL LOW (ref 36.0–46.0)
Hemoglobin: 9.6 g/dL — ABNORMAL LOW (ref 12.0–15.0)
MCH: 31 pg (ref 26.0–34.0)
MCHC: 34.9 g/dL (ref 30.0–36.0)
MCV: 88.7 fL (ref 80.0–100.0)
Platelets: 162 K/uL (ref 150–400)
RBC: 3.1 MIL/uL — ABNORMAL LOW (ref 3.87–5.11)
RDW: 14.2 % (ref 11.5–15.5)
WBC: 15 K/uL — ABNORMAL HIGH (ref 4.0–10.5)
nRBC: 0 % (ref 0.0–0.2)

## 2024-02-27 LAB — SODIUM: Sodium: 129 mmol/L — ABNORMAL LOW (ref 135–145)

## 2024-02-27 LAB — MAGNESIUM: Magnesium: 5.3 mg/dL — ABNORMAL HIGH (ref 1.7–2.4)

## 2024-02-27 MED ORDER — AMMONIA AROMATIC IN INHA
RESPIRATORY_TRACT | Status: AC
  Filled 2024-02-27: qty 10

## 2024-02-27 MED ORDER — BENZOCAINE-MENTHOL 20-0.5 % EX AERO
1.0000 | INHALATION_SPRAY | CUTANEOUS | Status: DC | PRN
  Administered 2024-02-27: 1 via TOPICAL
  Filled 2024-02-27: qty 56

## 2024-02-27 MED ORDER — SODIUM CHLORIDE 0.9 % IV SOLN
INTRAVENOUS | Status: AC
  Filled 2024-02-27: qty 1000

## 2024-02-27 MED ORDER — BISACODYL 10 MG RE SUPP: 10.0000 mg | Freq: Every day | RECTAL | Status: DC | PRN

## 2024-02-27 MED ORDER — WITCH HAZEL-GLYCERIN EX PADS: 1.0000 | MEDICATED_PAD | CUTANEOUS | Status: DC | PRN

## 2024-02-27 MED ORDER — TRANEXAMIC ACID-NACL 1000-0.7 MG/100ML-% IV SOLN
INTRAVENOUS | Status: AC
  Filled 2024-02-27: qty 100

## 2024-02-27 MED ORDER — ACETAMINOPHEN 325 MG PO TABS: 650.0000 mg | ORAL_TABLET | ORAL | Status: DC | PRN

## 2024-02-27 MED ORDER — DIBUCAINE (PERIANAL) 1 % EX OINT: 1.0000 | TOPICAL_OINTMENT | CUTANEOUS | Status: DC | PRN

## 2024-02-27 MED ORDER — IBUPROFEN 600 MG PO TABS
600.0000 mg | ORAL_TABLET | Freq: Four times a day (QID) | ORAL | Status: DC
  Administered 2024-02-27 – 2024-03-01 (×7): 600 mg via ORAL
  Filled 2024-02-27 (×9): qty 1

## 2024-02-27 MED ORDER — TRANEXAMIC ACID-NACL 1000-0.7 MG/100ML-% IV SOLN
1000.0000 mg | INTRAVENOUS | Status: AC
  Administered 2024-02-27: 1000 mg via INTRAVENOUS

## 2024-02-27 MED ORDER — DIPHENHYDRAMINE HCL 25 MG PO CAPS: 25.0000 mg | ORAL_CAPSULE | Freq: Four times a day (QID) | ORAL | Status: DC | PRN

## 2024-02-27 MED ORDER — PRENATAL MULTIVITAMIN CH
1.0000 | ORAL_TABLET | Freq: Every day | ORAL | Status: DC
  Administered 2024-02-27 – 2024-02-29 (×3): 1 via ORAL
  Filled 2024-02-27 (×3): qty 1

## 2024-02-27 MED ORDER — ONDANSETRON HCL 4 MG PO TABS: 4.0000 mg | ORAL_TABLET | ORAL | Status: DC | PRN

## 2024-02-27 MED ORDER — ONDANSETRON HCL 4 MG/2ML IJ SOLN: 4.0000 mg | INTRAMUSCULAR | Status: DC | PRN

## 2024-02-27 MED ORDER — OXYCODONE HCL 5 MG PO TABS: 5.0000 mg | ORAL_TABLET | ORAL | Status: DC | PRN

## 2024-02-27 MED ORDER — EPHEDRINE 5 MG/ML INJ: 10.0000 mg | Freq: Once | INTRAVENOUS | Status: DC

## 2024-02-27 MED ORDER — SIMETHICONE 80 MG PO CHEW: 80.0000 mg | CHEWABLE_TABLET | ORAL | Status: DC | PRN

## 2024-02-27 MED ORDER — COCONUT OIL OIL
1.0000 | TOPICAL_OIL | Status: DC | PRN
  Administered 2024-02-29: 1 via TOPICAL

## 2024-02-27 MED ORDER — DOCUSATE SODIUM 100 MG PO CAPS
100.0000 mg | ORAL_CAPSULE | Freq: Two times a day (BID) | ORAL | Status: DC
Start: 2024-02-28 — End: 2024-03-01
  Administered 2024-02-28 – 2024-03-01 (×5): 100 mg via ORAL
  Filled 2024-02-27 (×5): qty 1

## 2024-02-27 MED ORDER — RHO D IMMUNE GLOBULIN 1500 UNIT/2ML IJ SOSY
300.0000 ug | PREFILLED_SYRINGE | Freq: Once | INTRAMUSCULAR | Status: AC
  Administered 2024-02-28: 300 ug via INTRAVENOUS
  Filled 2024-02-27: qty 2

## 2024-02-27 MED ORDER — FLEET ENEMA RE ENEM: 1.0000 | ENEMA | Freq: Every day | RECTAL | Status: DC | PRN

## 2024-02-27 MED ORDER — OXYCODONE HCL 5 MG PO TABS: 10.0000 mg | ORAL_TABLET | ORAL | Status: DC | PRN

## 2024-02-27 MED ORDER — EPHEDRINE 5 MG/ML INJ
INTRAVENOUS | Status: AC
  Administered 2024-02-27: 10 mg
  Filled 2024-02-27: qty 5

## 2024-02-27 NOTE — Anesthesia Postprocedure Evaluation (Signed)
 Anesthesia Post Note  Patient: Elena Davia  Procedure(s) Performed: AN AD HOC LABOR EPIDURAL     Patient location during evaluation: Mother Baby Anesthesia Type: Epidural Level of consciousness: awake and alert Pain management: pain level controlled Vital Signs Assessment: post-procedure vital signs reviewed and stable Respiratory status: spontaneous breathing, nonlabored ventilation and respiratory function stable Cardiovascular status: stable Postop Assessment: no headache, no backache and epidural receding Anesthetic complications: no   No notable events documented.  Last Vitals:  Vitals:   02/27/24 0747 02/27/24 1146  BP: 136/86 120/63  Pulse: 71 82  Resp: 14 16  Temp: 36.9 C 36.9 C  SpO2: 100% 100%    Last Pain:  Vitals:   02/27/24 1146  TempSrc: Oral  PainSc: 0-No pain   Pain Goal:                   Emilya Justen

## 2024-02-27 NOTE — Progress Notes (Signed)
 POSTPARTUM PROGRESS NOTE  Post Partum Day #0  Subjective:  After delivery, magnesium  sulfate was set to 1g/dL given patient hypotension and nausea. It was then held at approximately 0400. CMP and CBC were checked prior to pulling epidural and sodium was found to be at 125. Because of this and the fact that MgSo4 was currently mixed in free water, IV NS was ordered at 125c/hr. Repeat value this AM was 129. Because of this, spoke with pharmacy to mix magnesium  sulfate with NS. Will repeat CMP this evening.   Foley has come out this AM, voiding and ambulating within room without issue. She denies nausea or vomiting.  Pain is well controlled. Lochia Minimal. Endorses fatigue but denies N/V, RUQ pain, HA, visual changes  Objective: Blood pressure 136/86, pulse 71, temperature 98.5 F (36.9 C), temperature source Oral, resp. rate 14, height 5' 1 (1.549 m), weight 59 kg, SpO2 100%, unknown if currently breastfeeding.  Physical Exam:  General: alert, cooperative and no distress Lochia:normal flow Chest: CTAB Heart: RRR no m/r/g Abdomen: +BS, soft, nontender Uterine Fundus: firm, 2cm below umbilicus GU: suture intact, healing well, no purulent drainage Extremities: trace pedeal BL edema, neg calf TTP BL, neg Homans BL  Recent Labs    02/26/24 1159 02/27/24 0240  HGB 11.4* 9.6*  HCT 32.8* 27.5*    Assessment/Plan:  ASSESSMENT: Joan Sanchez is a 28 y.o. H7E8897 s/p SVD @ [redacted]w[redacted]d. PNC c/b IOL for PreE w/ SF (BPs).   Continue routine postpartum care including PRN lactation PreE w/ SF: stable. No severe range BP since 2316 on 9/18 (IV labetalol )    *magnesium  sulfate was held at 0200 this AM    *restart at this time at 1g/dL       >Hyponatremia: stable: diagnosed immediately postpartum in setting of fatigue/nausea          -magnesium  sulfate will be restarted mixed with NS, NS @ 10cc in background. Repeat CMP 1700    *Pending pt symptoms, continue magnesium  sulfate at 1g/dL until 9599  tomorrow AM Anemia: new, asx. Minimal ambulation at this time, reassess once patient off mag   Anticipate DC home PPD#2-3   LOS: 2 days

## 2024-02-27 NOTE — Lactation Note (Signed)
 This note was copied from a baby's chart. Lactation Consultation Note  Patient Name: Joan Sanchez Unijb'd Date: 02/27/2024 Age:29 hours Reason for consult: Initial assessment;Early term 37-38.6wks  P2, Baby born at [redacted]w[redacted]d.  Mother on MgS04.   Baby recently breastfed for 50 & 15 min. Baby cueing when [facility] was in the room and mother latched baby in cradle hold with good depth. First child was born early at [redacted]w[redacted]d.  Mother states she breastfed and pumped. Discussed early term feeding behavior.  Suggest hand expressing and spoon feeding if baby has repeated sleepy at the breastand then ask to be set up with DEBP. Feed on demand with cues.  Goal 8-12+ times per day after first 24 hrs.  Place baby STS if not cueing.  Suggest calling for help as needed.   Maternal Data Has patient been taught Hand Expression?: Yes Does the patient have breastfeeding experience prior to this delivery?: Yes How long did the patient breastfeed?: one year  Feeding Mother's Current Feeding Choice: Breast Milk  LATCH Score Latch: Grasps breast easily, tongue down, lips flanged, rhythmical sucking.  Audible Swallowing: A few with stimulation  Type of Nipple: Everted at rest and after stimulation  Comfort (Breast/Nipple): Soft / non-tender  Hold (Positioning): No assistance needed to correctly position infant at breast.  LATCH Score: 9   Lactation Tools Discussed/Used    Interventions Interventions: Breast feeding basics reviewed;Education;LC Services brochure;CDC milk storage guidelines  Discharge Pump: Personal  Consult Status Consult Status: Follow-up Date: 02/28/24 Follow-up type: In-patient    Shannon Dines [facility] 02/27/2024, 10:26 AM

## 2024-02-28 LAB — CBC WITH DIFFERENTIAL/PLATELET
Abs Immature Granulocytes: 0.24 K/uL — ABNORMAL HIGH (ref 0.00–0.07)
Basophils Absolute: 0 K/uL (ref 0.0–0.1)
Basophils Relative: 0 %
Eosinophils Absolute: 0 K/uL (ref 0.0–0.5)
Eosinophils Relative: 0 %
HCT: 23 % — ABNORMAL LOW (ref 36.0–46.0)
Hemoglobin: 7.9 g/dL — ABNORMAL LOW (ref 12.0–15.0)
Immature Granulocytes: 2 %
Lymphocytes Relative: 13 %
Lymphs Abs: 1.9 K/uL (ref 0.7–4.0)
MCH: 31.1 pg (ref 26.0–34.0)
MCHC: 34.3 g/dL (ref 30.0–36.0)
MCV: 90.6 fL (ref 80.0–100.0)
Monocytes Absolute: 0.9 K/uL (ref 0.1–1.0)
Monocytes Relative: 6 %
Neutro Abs: 10.9 K/uL — ABNORMAL HIGH (ref 1.7–7.7)
Neutrophils Relative %: 79 %
Platelets: 149 K/uL — ABNORMAL LOW (ref 150–400)
RBC: 2.54 MIL/uL — ABNORMAL LOW (ref 3.87–5.11)
RDW: 14.9 % (ref 11.5–15.5)
WBC: 13.9 K/uL — ABNORMAL HIGH (ref 4.0–10.5)
nRBC: 0 % (ref 0.0–0.2)

## 2024-02-28 LAB — COMPREHENSIVE METABOLIC PANEL WITH GFR
ALT: 14 U/L (ref 0–44)
AST: 38 U/L (ref 15–41)
Albumin: 2.3 g/dL — ABNORMAL LOW (ref 3.5–5.0)
Alkaline Phosphatase: 94 U/L (ref 38–126)
Anion gap: 6 (ref 5–15)
BUN: 5 mg/dL — ABNORMAL LOW (ref 6–20)
CO2: 23 mmol/L (ref 22–32)
Calcium: 7 mg/dL — ABNORMAL LOW (ref 8.9–10.3)
Chloride: 108 mmol/L (ref 98–111)
Creatinine, Ser: 0.83 mg/dL (ref 0.44–1.00)
GFR, Estimated: >60 mL/min (ref >60)
Glucose, Bld: 87 mg/dL (ref 70–99)
Potassium: 4 mmol/L (ref 3.5–5.1)
Sodium: 137 mmol/L (ref 135–145)
Total Bilirubin: 0.4 mg/dL (ref 0.0–1.2)
Total Protein: 5 g/dL — ABNORMAL LOW (ref 6.5–8.1)

## 2024-02-28 MED ORDER — IRON SUCROSE 200 MG IVPB - SIMPLE MED: 200.0000 mg | Freq: Once | Status: DC

## 2024-02-28 MED ORDER — METHYLPREDNISOLONE SODIUM SUCC 125 MG IJ SOLR: 125.0000 mg | Freq: Once | INTRAMUSCULAR | Status: DC | PRN

## 2024-02-28 MED ORDER — EPINEPHRINE 0.3 MG/0.3ML IJ SOAJ: 0.3000 mg | Freq: Once | INTRAMUSCULAR | Status: DC | PRN

## 2024-02-28 MED ORDER — ALBUTEROL SULFATE (2.5 MG/3ML) 0.083% IN NEBU: 2.5000 mg | INHALATION_SOLUTION | Freq: Once | RESPIRATORY_TRACT | Status: DC | PRN

## 2024-02-28 MED ORDER — NIFEDIPINE ER OSMOTIC RELEASE 30 MG PO TB24
30.0000 mg | ORAL_TABLET | Freq: Every day | ORAL | Status: DC
  Administered 2024-02-28: 30 mg via ORAL
  Filled 2024-02-28: qty 1

## 2024-02-28 MED ORDER — DIPHENHYDRAMINE HCL 50 MG/ML IJ SOLN: 25.0000 mg | Freq: Once | INTRAMUSCULAR | Status: DC | PRN

## 2024-02-28 MED ORDER — NIFEDIPINE ER OSMOTIC RELEASE 30 MG PO TB24
30.0000 mg | ORAL_TABLET | Freq: Two times a day (BID) | ORAL | Status: DC
  Administered 2024-02-28 – 2024-02-29 (×3): 30 mg via ORAL
  Filled 2024-02-28 (×3): qty 1

## 2024-02-28 MED ORDER — SODIUM CHLORIDE 0.9 % IV SOLN
500.0000 mg | Freq: Once | INTRAVENOUS | Status: AC
  Administered 2024-02-28: 500 mg via INTRAVENOUS
  Filled 2024-02-28: qty 25

## 2024-02-28 MED ORDER — LABETALOL HCL 100 MG PO TABS
100.0000 mg | ORAL_TABLET | Freq: Once | ORAL | Status: AC
  Administered 2024-02-28: 100 mg via ORAL
  Filled 2024-02-28: qty 1

## 2024-02-28 MED ORDER — SODIUM CHLORIDE 0.9 % IV SOLN: INTRAVENOUS | Status: AC | PRN

## 2024-02-28 MED ORDER — SODIUM CHLORIDE 0.9 % IV BOLUS: 500.0000 mL | Freq: Once | INTRAVENOUS | Status: DC | PRN

## 2024-02-28 MED ORDER — LABETALOL HCL 100 MG PO TABS
100.0000 mg | ORAL_TABLET | Freq: Three times a day (TID) | ORAL | Status: DC
  Administered 2024-02-28 – 2024-02-29 (×4): 100 mg via ORAL
  Filled 2024-02-28 (×4): qty 1

## 2024-02-28 NOTE — Progress Notes (Signed)
 POSTPARTUM PROGRESS NOTE  Post Partum Day #1  Subjective:  S/p Mag completion around 0400 today. Patient is voiding and ambulating within room without issue. She denies nausea or vomiting.  Pain is well controlled. Lochia Minimal. Endorses fatigue but denies N/V, RUQ pain, HA, visual changes. Reviewed improved sodium as well as anemia. Patient is amenable to dose of IV iron  while inpatient. Of note, patient strongly desires to go home in afternoon to be with toddler. Advised this depends on BP control  Objective: Blood pressure (!) 153/95, pulse 72, temperature 98.3 F (36.8 C), temperature source Oral, resp. rate 16, height 5' 1 (1.549 m), weight 59 kg, SpO2 100%, unknown if currently breastfeeding.  Physical Exam:  General: alert, cooperative and no distress Lochia:normal flow Chest: CTAB Heart: RRR no m/r/g Abdomen: +BS, soft, nontender Uterine Fundus: firm, 2cm below umbilicus GU: suture intact, healing well, no purulent drainage Extremities: trace pedeal BL edema, neg calf TTP BL, neg Homans BL  Recent Labs    02/27/24 0240 02/28/24 0358  HGB 9.6* 7.9*  HCT 27.5* 23.0*    Assessment/Plan:  ASSESSMENT: Joan Sanchez is a 28 y.o. [id number] s/p SVD @ [redacted]w[redacted]d. PNC c/b IOL for PreE w/ SF (BPs).   Continue routine postpartum care including PRN lactation PreE w/ SF: stable. No severe range BP since 2316 on 9/18 (IV labetalol )    *magnesium  sulfate completed 0400 on 9/21       >Hyponatremia: improved after IV NS followed by magnesium  sulfate mixed into NS, 137 this AM.    *Restart patient nifedipine  given last BP of 150s/90s Anemia: new, asx. May have dilutional component but will order IV iron  x1 at this time  Anticipate DC home this afternoon vs tomorrow AM pending BP control with close outpatient follow up   LOS: 3 days

## 2024-02-28 NOTE — Progress Notes (Signed)
 BP (!) 154/94 (BP Location: Right Arm)   Pulse (!) 101   Temp 98.2 F (36.8 C) (Oral)   Resp 16   Ht 5' 1 (1.549 m)   Wt 59 kg   SpO2 100%   Breastfeeding Unknown   BMI 24.56 kg/m  BP range has still been 140s-150s/90s despite Nifedipine  30XL and addition of Labetalol  100mg . Patient understands, will stay overnight. Plan for nifedipine  30XL BID plus labetalol  100mg  TID

## 2024-02-28 NOTE — Progress Notes (Signed)
 CSW received consult for hx of anxiety. CSW met with MOB to offer support and complete assessment. When CSW entered room, MOB was observed sitting in hospital bed. Infant was asleep on her back in the bassinet. CSW introduced self and explained reason for consult. MOB presented as calm, was agreeable to consult and remained engaged throughout encounter.   CSW inquired how MOB is feeling emotionally since infant's arrival. MOB reports she is feeling good. CSW inquired about MOB's mental health history. MOB acknowledged a history of anxiety, reporting she has not been formally diagnosed but states she can feel anxious at times. MOB reports endorsing some health-related anxiety during pregnancy but reports feeling well overall. MOB states she is not prescribed medication and does not see a therapist. MOB shared her sister is a therapist who is a support for her. MOB identified FOB as an additional support. 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 CSW inquired is MOB experienced postpartum depression/anxiety (ppd/a) after the birth of her first daughter. MOB denied a history of ppd/a. MOB denied current SI/HI/DV.   CSW provided education regarding the baby blues period vs. perinatal mood disorders, discussed treatment and gave resources for mental health follow up if concerns arise.  CSW recommends self-evaluation during the postpartum time period using the New Mom Checklist from Postpartum Progress and encouraged MOB to contact a medical professional if symptoms are noted at any time.    MOB reports she has all needed items for infant, including a car seat and bassinet. MOB has chosen [facility] for infant's follow up care.  CSW provided review of Sudden Infant Death Syndrome (SIDS) precautions.    CSW identifies no further need for intervention and no barriers to discharge at this time.  Signed,  Sharyne LOIS Roulette, MSW, LCSWA, LCASA 02/28/2024 1:17 PM

## 2024-02-29 LAB — RH IG WORKUP (INCLUDES ABO/RH)
Fetal Screen: NEGATIVE
Gestational Age(Wks): 37
Unit division: 0

## 2024-02-29 MED ORDER — LABETALOL HCL 100 MG PO TABS
100.0000 mg | ORAL_TABLET | Freq: Once | ORAL | Status: AC
  Administered 2024-02-29: 100 mg via ORAL
  Filled 2024-02-29: qty 1

## 2024-02-29 MED ORDER — LABETALOL HCL 200 MG PO TABS
200.0000 mg | ORAL_TABLET | Freq: Three times a day (TID) | ORAL | Status: DC
  Administered 2024-02-29 – 2024-03-01 (×2): 200 mg via ORAL
  Filled 2024-02-29 (×2): qty 1

## 2024-02-29 NOTE — Progress Notes (Signed)
 Patient ID: Joan Sanchez, female   DOB: 26-Nov-1995, 28 y.o.   MRN: [id number] Pt received both procardia  30xl and 100mg  labetalol  at 1045am.  Her BP about prior to that was elevated at 154/94  Repeat now 3 hrs later is 155/96 however pt has her home BP monitor now ( which was clibrated by her OBGYN during prental care) and when she checks BP on it now it reads 128/68  I discussed plan to increase dose of BP meds ( likely labetalol  ) but pt hesitant given normal read on her BP cuff  She also declines anxiety medication though she thinks the beeping on machines and baby being under bilirubin lights is causing anxiety each time we check her BP  Plan to have pt self check her BP each time we do ( q 4hrs) and will treat if both elevated.

## 2024-02-29 NOTE — Progress Notes (Signed)
 Post Partum Day 2 Subjective: no complaints, up ad lib, voiding, tolerating PO, + flatus, and lochia mild. She specifically denies HA, blurry vision, CP or SOB. She is bonding well with her daughter who is under bilirubin light treatment in her room .She admits to anxiety w/ baby care and with being in the hospital. States took 6 weeks to get ideal BP control pp after last child.    Objective: Blood pressure (!) 143/93, pulse 81, temperature 98.4 F (36.9 C), temperature source Oral, resp. rate 18, height 5' 1 (1.549 m), weight 59 kg, SpO2 99%, unknown if currently breastfeeding.  Physical Exam:  General: alert, cooperative, and no distress Lochia: appropriate Uterine Fundus: firm Incision: n/a DVT Evaluation: No evidence of DVT seen on physical exam.  Recent Labs    02/27/24 0240 02/28/24 0358  HGB 9.6* 7.9*  HCT 27.5* 23.0*    Assessment/Plan: 27yo H7E8897 female on PPD#2 s/p IOL for PreE with severe features   PreE - BP not under great control despite 24hr magnesium  sulfate pp and regimen of procardia  ( 30xl every day)  and labetalol  ( 100mg  tid) . Pt is due for both meds now. Plan to titrate doses as needed today for better control. Reviewed risks for ecclampsia with inadequate control; risk of re hospitalization and agreed to consider anxiety considerations.. Baby likely not getting discharged today so pt somewhat ok with plan to extend stay but would like discharge to home when baby discharged.   Gestational thrombocytopenia - Platelets stable at 149K on 02/28/24   Anemia of pregnancy - stable on PO iron  supps. Asymptomatic  Anxiety -  declines meds   Rh negative - pp rhogam to be given prior to discharge.     LOS: 4 days   Kaidon Kinker W Zeph Riebel, DO 02/29/2024, 9:53 AM

## 2024-02-29 NOTE — Lactation Note (Addendum)
 This note was copied from a baby's chart. Lactation Consultation Note  Patient Name: Joan Sanchez Unijb'd Date: 02/29/2024 Age:29 hours Reason for consult: Follow-up assessment;Early term 37-38.6wks;Hyperbilirubinemia;Infant weight loss  P2, Baby [redacted]w[redacted]d.  7.16% weight loss. Mother was on MgSo4.  Baby under phototherapy. 2 voids and 6 stools in the last 24 hours.  Baby has begun to get sleepy at the breast so mother has been post pumping and giving volume back to baby. Mother pumped 10 ml with the last pumping session. Baby consumed 5 ml with slow flow nipple at 1010 feeding.  Reviewed feeding q 2-3 hours.  Wake baby if needed. Post pumping and continue to give volume to baby. Expectations of sleepiness to continue once home due to gestation and hyperbilirubinemia. Observed latch with good depth and intermittent swallows with stimulation.  First child was born at 1 weeks so mother did both breastfeeding and pumping and supplementing. Reviewed milk storage.   Maternal Data Has patient been taught Hand Expression?: Yes  Feeding Mother's Current Feeding Choice: Breast Milk  LATCH Score Latch: Grasps breast easily, tongue down, lips flanged, rhythmical sucking.  Audible Swallowing: A few with stimulation  Type of Nipple: Everted at rest and after stimulation  Comfort (Breast/Nipple): Soft / non-tender  Hold (Positioning): No assistance needed to correctly position infant at breast.  LATCH Score: 9   Lactation Tools Discussed/Used Tools: Pump;Flanges Flange Size: 21 Breast pump type: Double-Electric Breast Pump Pump Education: Milk Storage Reason for Pumping: stimulation and supplementation Pumping frequency: q 3 hours for 15 min Pumped volume: 10 mL  Interventions Interventions: Breast feeding basics reviewed;Education;DEBP  Discharge Pump: Personal;DEBP (Spectra )  Consult Status Consult Status: Follow-up Date: 03/01/24 Follow-up type:  In-patient    Shannon Levorn Lemme  RN, IBCLC 02/29/2024, 2:00 PM

## 2024-02-29 NOTE — Progress Notes (Signed)
 Patient ID: Joan Sanchez, female   DOB: Oct 29, 1995, 28 y.o.   MRN: [id number] An hour after pt received extra dose of labetalol  100mg , her BP was essntially unchanged.   I asked RN to administer her pm dose of procardia  30xl and increased dose of 200mg  po an hour earlier.  Plan to recheck herr BP an hour later  If no improvement noted to start to trend will consider adding lasixs vs increasing medication dose further

## 2024-02-29 NOTE — Progress Notes (Signed)
 Patient ID: Joan Sanchez, female   DOB: 01-29-1996, 28 y.o.   MRN: [id number] Pts BP continues to be elevated  Last check was 151/95   Per pt her own cuff noted 130/85  Denies HA or visual changes, No CP or SOB  Pt is now amenable to increasing her medication dose to better improve her hospital noted BPs ( despite her own reads)  We discussed increasing her labatalol - one extra 100mg  dose to be given now then rest to be increased to 200mg  tid from 100mg  ( next dose due to 2200).   Will continue to monitor BP for control.

## 2024-03-01 ENCOUNTER — Encounter (HOSPITAL_COMMUNITY)

## 2024-03-01 ENCOUNTER — Other Ambulatory Visit (HOSPITAL_COMMUNITY): Payer: Self-pay

## 2024-03-01 LAB — SURGICAL PATHOLOGY

## 2024-03-01 MED ORDER — NIFEDIPINE ER 30 MG PO TB24
30.0000 mg | ORAL_TABLET | Freq: Two times a day (BID) | ORAL | 11 refills | Status: AC
  Filled 2024-03-01: qty 60, 30d supply, fill #0

## 2024-03-01 MED ORDER — LABETALOL HCL 200 MG PO TABS
200.0000 mg | ORAL_TABLET | Freq: Three times a day (TID) | ORAL | 11 refills | Status: AC
  Filled 2024-03-01: qty 90, 30d supply, fill #0

## 2024-03-01 MED ORDER — IBUPROFEN 200 MG PO TABS: 600.0000 mg | ORAL_TABLET | Freq: Four times a day (QID) | ORAL | Status: AC | PRN

## 2024-03-01 MED ORDER — NIFEDIPINE ER OSMOTIC RELEASE 30 MG PO TB24
30.0000 mg | ORAL_TABLET | Freq: Two times a day (BID) | ORAL | Status: DC
  Administered 2024-03-01: 30 mg via ORAL
  Filled 2024-03-01: qty 1

## 2024-03-01 MED ORDER — ACETAMINOPHEN 325 MG PO TABS: 650.0000 mg | ORAL_TABLET | Freq: Four times a day (QID) | ORAL | Status: AC | PRN

## 2024-03-01 NOTE — Progress Notes (Signed)
 Post Partum Day 3 Subjective: Patient is doing well this morning. Pain is controlled. Ambulating, voiding, tolerating PO. Passed egg size clot this AM after getting up for first time from overnight, otherwise minimal lochia. Breastfeeding.  No headache, vision changes, RUQ pain.   Objective: Patient Vitals for the past 24 hrs:  BP Temp Temp src Pulse Resp SpO2  03/01/24 0842 137/83 98.3 F (36.8 C) Oral 75 18 100 %  03/01/24 0622 (!) 141/99 98.6 F (37 C) Oral 93 17 100 %  03/01/24 0236 (!) 142/88 98.5 F (36.9 C) Oral 84 17 100 %  02/29/24 2246 (!) 143/91 -- -- 81 -- --  02/29/24 2028 (!) 154/95 -- -- 82 -- --  02/29/24 2027 -- 98.6 F (37 C) Oral -- 16 --  02/29/24 1622 (!) 151/95 98.8 F (37.1 C) Oral 86 17 99 %  02/29/24 1451 (!) 155/96 -- -- 91 -- --  02/29/24 1120 (!) 154/94 98.5 F (36.9 C) Oral 89 18 100 %    Physical Exam:  General: alert, cooperative, and no distress Lochia: appropriate Uterine Fundus: firm DVT Evaluation: No evidence of DVT seen on physical exam.  Recent Labs    02/28/24 0358  WBC 13.9*  HGB 7.9*  HCT 23.0*  PLT 149*    Recent Labs    02/28/24 0358  NA 137  K 4.0  CL 108  BUN 5*  CREATININE 0.83  GLUCOSE 87  BILITOT 0.4  ALT 14  AST 38  ALKPHOS 94  PROT 5.0*  ALBUMIN 2.3*    Recent Labs    02/28/24 0358  CALCIUM 7.0*    No results for input(s): PROTIME, APTT, INR in the last 72 hours.  No results for input(s): PROTIME, APTT, INR, FIBRINOGEN in the last 72 hours. Assessment/Plan: Joan Sanchez 27 y.o. G2P1102 PPD#3 sp SVD 1. EER:mnlupwz PP care 2. PreE with SF: s/p magnesium , antihypertensives have been titrated to Procardia  Xl 30mg  BID and Labetalol  200mg  TID. Documented BPs have been 130s-140s/80s overnight. Her home BP cuff is mostly in 120s.  3. ABLA anemia, clinically significant: s/p IV iron , feeling well this morning 4. Anxiety, no meds: close outpt follow up 5. RH neg: baby Rh pos, s/p  Rhogam  Dispo: stable for discharge home. Instructions reviewed. Will see me in office for BP check in 2-3, track home BP and call if elevated or low.     LOS: 5 days   Joan Sanchez 03/01/2024, 9:50 AM

## 2024-03-01 NOTE — Progress Notes (Signed)
 Patient ID: Joan Sanchez, female   DOB: 04/17/1996, 28 y.o.   MRN: [id number]  BP 142-143/88-91 over past 7 hrs  Adjusted timing for meds. Next dose of both labetalol  200mg  and procardia  30xl due at 0600am.

## 2024-03-01 NOTE — Lactation Note (Signed)
 This note was copied from a baby's chart. Lactation Consultation Note  Patient Name: Joan Sanchez Date: 03/01/2024 Age:29 days Reason for consult: Follow-up assessment;Early term 37-38.6wks;Infant weight loss;Other (Comment) (-6/79% WL)  Visited with family of 87 4/58 weeks old female Joan Sanchez; Joan Sanchez is a P2 and experienced breastfeeding. She reported breastfeeding and pumping are going well, baby got off phototherapy and was cluster feeding last night, her milk has already started to transition, praised her for all her efforts. She is taking [name] home today. Reviewed discharge education and the importance of consistent nipple stimulation through baby's mouth, pumping or both for the onset of secretory activation and the prevention of engorgement. Her plan is to primarily breastfeed at home but she also has a Spectra  pump in case she needs to pump. She politely declined a referral for LC OP but has their contact info in case she needs to reach out. No other support person at this time. All questions and concerns answered, family to contact [facility] services PRN.  Feeding Mother's Current Feeding Choice: Breast Milk  Lactation Tools Discussed/Used Tools: Pump;Flanges Flange Size: 21 Breast pump type: Double-Electric Breast Pump Pump Education: Setup, frequency, and cleaning;Milk Storage Reason for Pumping: induction of lactation, supplementaiton Pumping frequency: 2-3 times/24 hours; recommended pumping more often if needed Pumped volume: 10 mL (10-15 ml)  Interventions Interventions: Breast feeding basics reviewed;Coconut oil;DEBP;Education;NICU Pumping Log  Discharge Discharge Education: Engorgement and breast care;Warning signs for feeding baby;Outpatient recommendation Pump: DEBP;Personal (Spectra  S2)  Consult Status Consult Status: Complete Date: 03/01/24 Follow-up type: Call as needed   Cerita Rabelo GORMAN Crate 03/01/2024, 11:41 AM

## 2024-03-01 NOTE — Discharge Summary (Signed)
 Postpartum Discharge Summary  Date of Service updated 03/01/24      Patient Name: Joan Sanchez DOB: 12/30/1995 MRN: [id number]  Date of admission: 02/25/2024 Delivery date:02/27/2024 Delivering provider: Shalen Petrak E Date of discharge: 03/01/2024  Admitting diagnosis: Pre-eclampsia in third trimester [O14.93] Intrauterine pregnancy: [redacted]w[redacted]d     Secondary diagnosis:  Principal Problem:   Pre-eclampsia in third trimester  Additional problems: gestational thrombocytopenia    Discharge diagnosis: Term Pregnancy Delivered, Preeclampsia (severe), and Anemia                                              Post partum procedures:rhogam Augmentation: AROM and Pitocin  Complications: None  Hospital course: Induction of Labor With Vaginal Delivery   28 y.o. yo [id number] at [redacted]w[redacted]d was admitted to the hospital 02/25/2024 for induction of labor.  Indication for induction: Preeclampsia.  Patient had an labor course complicated by nothing Membrane Rupture Time/Date: 5:27 AM,02/26/2024  Delivery Method:Vaginal, Spontaneous Operative Delivery:N/A Episiotomy: None Lacerations:  1st degree Details of delivery can be found in separate delivery note.  Patient had a postpartum course complicated by hypertension requiring titration of antihypertensives, ultimately discharged on Procardia  XL 30mg  BID and Labetalol  200mg  TID. Patient is discharged home 03/01/24.  Newborn Data: Birth date:02/27/2024 Birth time:1:54 AM Gender:Female Living status:Living Apgars:7 ,8  Weight:3090 g  Magnesium  Sulfate received: Yes: Seizure prophylaxis BMZ received: No Rhophylac :Yes MMR:N/A T-DaP:Given prenatally Flu: No RSV Vaccine received: No Transfusion:No Immunizations administered: There is no immunization history for the selected administration types on file for this patient.  Physical exam  Vitals:   02/29/24 2246 03/01/24 0236 03/01/24 0622 03/01/24 0842  BP: (!) 143/91 (!) 142/88 (!) 141/99 137/83   Pulse: 81 84 93 75  Resp:  17 17 18   Temp:  98.5 F (36.9 C) 98.6 F (37 C) 98.3 F (36.8 C)  TempSrc:  Oral Oral Oral  SpO2:  100% 100% 100%  Weight:      Height:       General: alert, cooperative, and no distress Lochia: appropriate Uterine Fundus: firm Incision: N/A DVT Evaluation: No evidence of DVT seen on physical exam. Labs: Lab Results  Component Value Date   WBC 13.9 (H) 02/28/2024   HGB 7.9 (L) 02/28/2024   HCT 23.0 (L) 02/28/2024   MCV 90.6 02/28/2024   PLT 149 (L) 02/28/2024      Latest Ref Rng & Units 02/28/2024    3:58 AM  CMP  Glucose 70 - 99 mg/dL 87   BUN 6 - 20 mg/dL 5   Creatinine 9.55 - 8.99 mg/dL 9.16   Sodium 864 - 854 mmol/L 137   Potassium 3.5 - 5.1 mmol/L 4.0   Chloride 98 - 111 mmol/L 108   CO2 22 - 32 mmol/L 23   Calcium 8.9 - 10.3 mg/dL 7.0   Total Protein 6.5 - 8.1 g/dL 5.0   Total Bilirubin 0.0 - 1.2 mg/dL 0.4   Alkaline Phos 38 - 126 U/L 94   AST 15 - 41 U/L 38   ALT 0 - 44 U/L 14    Edinburgh Score:    02/27/2024    3:15 PM  Edinburgh Postnatal Depression Scale Screening Tool  I have been able to laugh and see the funny side of things. 0  I have looked forward with enjoyment to things. 0  I have blamed  myself unnecessarily when things went wrong. 1  I have been anxious or worried for no good reason. 0  I have felt scared or panicky for no good reason. 0  Things have been getting on top of me. 1  I have been so unhappy that I have had difficulty sleeping. 0  I have felt sad or miserable. 0  I have been so unhappy that I have been crying. 0  The thought of harming myself has occurred to me. 0  Edinburgh Postnatal Depression Scale Total 2      After visit meds:  Allergies as of 03/01/2024   No Known Allergies      Medication List     STOP taking these medications    aspirin 81 MG chewable tablet   ferrous sulfate 325 (65 FE) MG tablet       TAKE these medications    acetaminophen  325 MG tablet Commonly  known as: Tylenol  Take 2 tablets (650 mg total) by mouth every 6 (six) hours as needed for mild pain (pain score 1-3).   ibuprofen  200 MG tablet Commonly known as: ADVIL  Take 3 tablets (600 mg total) by mouth every 6 (six) hours as needed.   labetalol  200 MG tablet Commonly known as: NORMODYNE  Take 1 tablet (200 mg total) by mouth 3 (three) times daily.   multivitamin-prenatal 27-0.8 MG Tabs tablet Take 1 tablet by mouth daily at 12 noon.   NIFEdipine  30 MG 24 hr tablet Commonly known as: ADALAT  CC Take 1 tablet (30 mg total) by mouth 2 (two) times daily.         Discharge home in stable condition Infant Feeding: Breast Infant Disposition:home with mother Discharge instruction: per After Visit Summary and Postpartum booklet. Activity: Advance as tolerated. Pelvic rest for 6 weeks.  Diet: routine diet Anticipated Birth Control: Unsure Postpartum Appointment:4 weeks Additional Postpartum F/U: BP check 2-3 days Future Appointments:No future appointments. Follow up Visit:  Follow-up Information     Diedre Rosaline BRAVO, MD. Schedule an appointment as soon as possible for a visit.   Specialty: Obstetrics and Gynecology Why: 2-3 days for BP check Contact information: [address] [place] [address] [phone]                     03/01/2024 Rosaline BRAVO Diedre, MD

## 2024-03-10 ENCOUNTER — Telehealth (HOSPITAL_COMMUNITY): Payer: Self-pay | Admitting: *Deleted

## 2024-03-10 NOTE — Telephone Encounter (Signed)
 03/10/2024  Name: Joan Sanchez MRN: [id number] DOB: 1995/08/20  Reason for Call:  Transition of Care Hospital Discharge Call  Contact Status: Patient Contact Status: Message  Language assistant needed:          Follow-Up Questions:    Van Postnatal Depression Scale:  In the Past 7 Days:    PHQ2-9 Depression Scale:     Discharge Follow-up:    Post-discharge interventions: NA  Mliss Sieve, RN 03/10/2024 10:15
